# Patient Record
Sex: Female | Born: 1955 | Race: Black or African American | Hispanic: No | Marital: Married | Smoking: Never smoker
Health system: Southern US, Community
[De-identification: ages and names within clinical notes are randomized; demographics above are authoritative.]

## PROBLEM LIST (undated history)

## (undated) DIAGNOSIS — I10 Essential (primary) hypertension: Secondary | ICD-10-CM

## (undated) DIAGNOSIS — E7800 Pure hypercholesterolemia, unspecified: Secondary | ICD-10-CM

## (undated) DIAGNOSIS — T7840XA Allergy, unspecified, initial encounter: Secondary | ICD-10-CM

## (undated) DIAGNOSIS — J309 Allergic rhinitis, unspecified: Secondary | ICD-10-CM

## (undated) DIAGNOSIS — M1990 Unspecified osteoarthritis, unspecified site: Secondary | ICD-10-CM

## (undated) HISTORY — DX: Allergy, unspecified, initial encounter: T78.40XA

## (undated) HISTORY — DX: Pure hypercholesterolemia, unspecified: E78.00

## (undated) HISTORY — DX: Essential (primary) hypertension: I10

## (undated) HISTORY — DX: Unspecified osteoarthritis, unspecified site: M19.90

## (undated) HISTORY — DX: Allergic rhinitis, unspecified: J30.9

## (undated) HISTORY — PX: TUBAL LIGATION: SHX77

---

## 2006-09-30 HISTORY — PX: REDUCTION MAMMAPLASTY: SUR839

## 2008-10-18 ENCOUNTER — Encounter: Admission: RE | Admit: 2008-10-18 | Discharge: 2008-10-18 | Payer: Self-pay | Admitting: Internal Medicine

## 2008-10-19 ENCOUNTER — Encounter: Admission: RE | Admit: 2008-10-19 | Discharge: 2008-10-19 | Payer: Self-pay | Admitting: Internal Medicine

## 2009-11-29 ENCOUNTER — Encounter: Admission: RE | Admit: 2009-11-29 | Discharge: 2009-11-29 | Payer: Self-pay | Admitting: Internal Medicine

## 2010-06-17 ENCOUNTER — Emergency Department (HOSPITAL_COMMUNITY): Admission: EM | Admit: 2010-06-17 | Discharge: 2010-06-17 | Payer: Self-pay | Admitting: Emergency Medicine

## 2010-10-21 ENCOUNTER — Encounter: Payer: Self-pay | Admitting: Internal Medicine

## 2010-11-07 ENCOUNTER — Other Ambulatory Visit: Payer: Self-pay | Admitting: Internal Medicine

## 2010-11-07 DIAGNOSIS — Z1231 Encounter for screening mammogram for malignant neoplasm of breast: Secondary | ICD-10-CM

## 2010-12-04 ENCOUNTER — Ambulatory Visit
Admission: RE | Admit: 2010-12-04 | Discharge: 2010-12-04 | Disposition: A | Discharge status: Home / Self Care | Payer: BC Managed Care – PPO | Source: Ambulatory Visit | Attending: Internal Medicine | Admitting: Internal Medicine

## 2010-12-04 DIAGNOSIS — Z1231 Encounter for screening mammogram for malignant neoplasm of breast: Secondary | ICD-10-CM

## 2010-12-13 LAB — POCT CARDIAC MARKERS
CKMB, poc: <1 ng/mL — ABNORMAL LOW (ref 1.0–8.0)
Myoglobin, poc: 98.2 ng/mL (ref 12–200)

## 2010-12-13 LAB — CBC
Hemoglobin: 13.5 g/dL (ref 12.0–15.0)
MCHC: 34.4 g/dL (ref 30.0–36.0)
MCV: 95.4 fL (ref 78.0–100.0)

## 2010-12-13 LAB — DIFFERENTIAL
Basophils Relative: 1 % (ref 0–1)
Eosinophils Absolute: 0 10*3/uL (ref 0.0–0.7)
Lymphocytes Relative: 40 % (ref 12–46)
Lymphs Abs: 2 10*3/uL (ref 0.7–4.0)
Monocytes Absolute: 0.3 10*3/uL (ref 0.1–1.0)
Monocytes Relative: 6 % (ref 3–12)
Neutro Abs: 2.6 10*3/uL (ref 1.7–7.7)
Neutrophils Relative %: 53 % (ref 43–77)

## 2010-12-13 LAB — POCT I-STAT, CHEM 8
Hemoglobin: 14.6 g/dL (ref 12.0–15.0)
TCO2: 31 mmol/L (ref 0–100)

## 2011-12-17 ENCOUNTER — Other Ambulatory Visit: Payer: Self-pay | Admitting: Internal Medicine

## 2011-12-17 DIAGNOSIS — Z1231 Encounter for screening mammogram for malignant neoplasm of breast: Secondary | ICD-10-CM

## 2012-01-01 ENCOUNTER — Ambulatory Visit
Admission: RE | Admit: 2012-01-01 | Discharge: 2012-01-01 | Disposition: A | Discharge status: Home / Self Care | Payer: BC Managed Care – PPO | Source: Ambulatory Visit | Attending: Internal Medicine | Admitting: Internal Medicine

## 2012-01-01 DIAGNOSIS — Z1231 Encounter for screening mammogram for malignant neoplasm of breast: Secondary | ICD-10-CM

## 2013-02-23 ENCOUNTER — Other Ambulatory Visit: Payer: Self-pay

## 2013-02-23 DIAGNOSIS — Z1231 Encounter for screening mammogram for malignant neoplasm of breast: Secondary | ICD-10-CM

## 2013-03-22 ENCOUNTER — Ambulatory Visit: Payer: BC Managed Care – PPO

## 2013-03-22 ENCOUNTER — Ambulatory Visit
Admission: RE | Admit: 2013-03-22 | Discharge: 2013-03-22 | Disposition: A | Discharge status: Home / Self Care | Payer: BC Managed Care – PPO | Source: Ambulatory Visit

## 2013-03-22 DIAGNOSIS — Z1231 Encounter for screening mammogram for malignant neoplasm of breast: Secondary | ICD-10-CM

## 2014-05-11 ENCOUNTER — Other Ambulatory Visit: Payer: Self-pay

## 2014-05-11 DIAGNOSIS — Z1231 Encounter for screening mammogram for malignant neoplasm of breast: Secondary | ICD-10-CM

## 2014-05-20 ENCOUNTER — Ambulatory Visit
Admission: RE | Admit: 2014-05-20 | Discharge: 2014-05-20 | Disposition: A | Discharge status: Home / Self Care | Payer: BC Managed Care – PPO | Source: Ambulatory Visit

## 2014-05-20 DIAGNOSIS — Z1231 Encounter for screening mammogram for malignant neoplasm of breast: Secondary | ICD-10-CM

## 2015-05-03 ENCOUNTER — Other Ambulatory Visit: Payer: Self-pay

## 2015-05-03 DIAGNOSIS — Z1231 Encounter for screening mammogram for malignant neoplasm of breast: Secondary | ICD-10-CM

## 2015-05-24 ENCOUNTER — Ambulatory Visit
Admission: RE | Admit: 2015-05-24 | Discharge: 2015-05-24 | Disposition: A | Discharge status: Home / Self Care | Payer: BC Managed Care – PPO | Source: Ambulatory Visit

## 2015-05-24 DIAGNOSIS — Z1231 Encounter for screening mammogram for malignant neoplasm of breast: Secondary | ICD-10-CM

## 2016-04-18 ENCOUNTER — Other Ambulatory Visit: Payer: Self-pay | Admitting: Internal Medicine

## 2016-04-18 DIAGNOSIS — Z1231 Encounter for screening mammogram for malignant neoplasm of breast: Secondary | ICD-10-CM

## 2016-04-18 DIAGNOSIS — Z9889 Other specified postprocedural states: Secondary | ICD-10-CM

## 2016-05-24 ENCOUNTER — Ambulatory Visit
Admission: RE | Admit: 2016-05-24 | Discharge: 2016-05-24 | Disposition: A | Discharge status: Home / Self Care | Payer: BC Managed Care – PPO | Source: Ambulatory Visit | Attending: Internal Medicine | Admitting: Internal Medicine

## 2016-05-24 ENCOUNTER — Ambulatory Visit: Payer: BC Managed Care – PPO

## 2016-05-24 DIAGNOSIS — Z1231 Encounter for screening mammogram for malignant neoplasm of breast: Secondary | ICD-10-CM

## 2016-05-24 DIAGNOSIS — Z9889 Other specified postprocedural states: Secondary | ICD-10-CM

## 2016-07-31 LAB — HM COLONOSCOPY

## 2017-06-12 ENCOUNTER — Other Ambulatory Visit: Payer: Self-pay | Admitting: Internal Medicine

## 2017-06-12 DIAGNOSIS — Z1231 Encounter for screening mammogram for malignant neoplasm of breast: Secondary | ICD-10-CM

## 2017-06-23 ENCOUNTER — Ambulatory Visit
Admission: RE | Admit: 2017-06-23 | Discharge: 2017-06-23 | Disposition: A | Discharge status: Home / Self Care | Payer: BC Managed Care – PPO | Source: Ambulatory Visit | Attending: Internal Medicine | Admitting: Internal Medicine

## 2017-06-23 DIAGNOSIS — Z1231 Encounter for screening mammogram for malignant neoplasm of breast: Secondary | ICD-10-CM

## 2018-05-18 ENCOUNTER — Other Ambulatory Visit: Payer: Self-pay | Admitting: Internal Medicine

## 2018-05-18 DIAGNOSIS — Z1231 Encounter for screening mammogram for malignant neoplasm of breast: Secondary | ICD-10-CM

## 2018-06-11 DIAGNOSIS — G4700 Insomnia, unspecified: Secondary | ICD-10-CM | POA: Diagnosis not present

## 2018-06-11 DIAGNOSIS — E785 Hyperlipidemia, unspecified: Secondary | ICD-10-CM | POA: Diagnosis not present

## 2018-06-11 DIAGNOSIS — Z0000 Encounter for general adult medical examination without abnormal findings: Secondary | ICD-10-CM

## 2018-06-11 DIAGNOSIS — R946 Abnormal results of thyroid function studies: Secondary | ICD-10-CM | POA: Diagnosis not present

## 2018-06-11 DIAGNOSIS — R030 Elevated blood-pressure reading, without diagnosis of hypertension: Secondary | ICD-10-CM | POA: Diagnosis not present

## 2018-06-26 ENCOUNTER — Ambulatory Visit: Payer: BC Managed Care – PPO

## 2018-06-30 ENCOUNTER — Ambulatory Visit: Payer: BC Managed Care – PPO | Admitting: Nurse Practitioner

## 2018-06-30 VITALS — BP 122/82 | HR 73 | Temp 98.1°F | Ht 64.0 in | Wt 185.6 lb

## 2018-06-30 DIAGNOSIS — J0110 Acute frontal sinusitis, unspecified: Secondary | ICD-10-CM | POA: Diagnosis not present

## 2018-06-30 MED ORDER — MOMETASONE FUROATE 50 MCG/ACT NA SUSP: 2.0000 | Freq: Every day | NASAL | 2 refills | Status: DC

## 2018-06-30 NOTE — Progress Notes (Addendum)
   Subjective:    Patient ID: Ruth Davis, female    DOB: 08/03/1956, 62 y.o.   MRN: [id number]  Sinus Problem  This is a new problem. The current episode started in the past 7 days. The problem has been gradually worsening since onset. There has been no fever. Her pain is at a severity of 9/10. Associated symptoms include congestion, coughing, headaches, sinus pressure and a sore throat. (Body aches) Past treatments include antibiotics (one dose). The treatment provided no relief.        Review of Systems  Constitutional: Negative.   HENT: Positive for congestion, sinus pressure, sinus pain and sore throat.   Respiratory: Positive for cough.   Cardiovascular: Negative.   Neurological: Positive for headaches.       Objective:   Physical Exam  Constitutional: She appears well-developed.  HENT:  Head: Normocephalic.  Right Ear: Tympanic membrane is bulging.  Left Ear: Tympanic membrane is bulging.  Nose: Mucosal edema present. Right sinus exhibits frontal sinus tenderness. Left sinus exhibits frontal sinus tenderness.  Mouth/Throat: Oropharynx is clear and moist.  Eyes: Conjunctivae are normal.  Neck: Normal range of motion.  Cardiovascular: Normal rate, regular rhythm and normal heart sounds.  Pulmonary/Chest: Effort normal and breath sounds normal.  Skin: Skin is warm.          Assessment & Plan:  .1. Acute non-recurrent frontal sinusitis  She is being treated with azithromycin, take nasonex and zyrtec daily.  If symptoms are not better return call to office.  - mometasone (NASONEX) 50 MCG/ACT nasal spray; Place 2 sprays into the nose daily.  Dispense: 17 g; Refill: 2

## 2018-06-30 NOTE — Patient Instructions (Addendum)
Drink adequate amounts of water.  Continue taking HBP [place] as needed.  Return call to office on Wednesday if not better. Will give sample of Zyrtec, can purchase over the counter if effective.

## 2018-07-08 ENCOUNTER — Ambulatory Visit
Admission: RE | Admit: 2018-07-08 | Discharge: 2018-07-08 | Disposition: A | Discharge status: Home / Self Care | Payer: BC Managed Care – PPO | Source: Ambulatory Visit | Attending: Internal Medicine | Admitting: Internal Medicine

## 2018-07-08 DIAGNOSIS — Z1231 Encounter for screening mammogram for malignant neoplasm of breast: Secondary | ICD-10-CM

## 2018-09-10 ENCOUNTER — Encounter: Payer: Self-pay | Admitting: Internal Medicine

## 2018-09-10 ENCOUNTER — Ambulatory Visit: Payer: BC Managed Care – PPO | Admitting: Internal Medicine

## 2018-09-10 VITALS — BP 124/88 | HR 57 | Temp 98.0°F | Ht 64.0 in | Wt 189.8 lb

## 2018-09-10 DIAGNOSIS — Z6832 Body mass index (BMI) 32.0-32.9, adult: Secondary | ICD-10-CM | POA: Diagnosis not present

## 2018-09-10 DIAGNOSIS — R7989 Other specified abnormal findings of blood chemistry: Secondary | ICD-10-CM | POA: Diagnosis not present

## 2018-09-10 DIAGNOSIS — E7800 Pure hypercholesterolemia, unspecified: Secondary | ICD-10-CM

## 2018-09-10 DIAGNOSIS — Z712 Person consulting for explanation of examination or test findings: Secondary | ICD-10-CM

## 2018-09-10 NOTE — Patient Instructions (Signed)
Hypothyroidism Hypothyroidism is a disorder of the thyroid. The thyroid is a large gland that is located in the lower front of the neck. The thyroid releases hormones that control how the body works. With hypothyroidism, the thyroid does not make enough of these hormones. What are the causes? Causes of hypothyroidism may include:  Viral infections.  Pregnancy.  Your own defense system (immune system) attacking your thyroid.  Certain medicines.  Birth defects.  Past radiation treatments to your head or neck.  Past treatment with radioactive iodine.  Past surgical removal of part or all of your thyroid.  Problems with the gland that is located in the center of your brain (pituitary).  What are the signs or symptoms? Signs and symptoms of hypothyroidism may include:  Feeling as though you have no energy (lethargy).  Inability to tolerate cold.  Weight gain that is not explained by a change in diet or exercise habits.  Dry skin.  Coarse hair.  Menstrual irregularity.  Slowing of thought processes.  Constipation.  Sadness or depression.  How is this diagnosed? Your health care provider may diagnose hypothyroidism with blood tests and ultrasound tests. How is this treated? Hypothyroidism is treated with medicine that replaces the hormones that your body does not make. After you begin treatment, it may take several weeks for symptoms to go away. Follow these instructions at home:  Take medicines only as directed by your health care provider.  If you start taking any new medicines, tell your health care provider.  Keep all follow-up visits as directed by your health care provider. This is important. As your condition improves, your dosage needs may change. You will need to have blood tests regularly so that your health care provider can watch your condition. Contact a health care provider if:  Your symptoms do not get better with treatment.  You are taking thyroid  replacement medicine and: ? You sweat excessively. ? You have tremors. ? You feel anxious. ? You lose weight rapidly. ? You cannot tolerate heat. ? You have emotional swings. ? You have diarrhea. ? You feel weak. Get help right away if:  You develop chest pain.  You develop an irregular heartbeat.  You develop a rapid heartbeat. This information is not intended to replace advice given to you by your health care provider. Make sure you discuss any questions you have with your health care provider. Document Released: 09/16/2005 Document Revised: 02/22/2016 Document Reviewed: 02/01/2014 Elsevier Interactive Patient Education  2018 [employer]  

## 2018-09-11 LAB — TSH: TSH: 2.92 u[IU]/mL (ref 0.450–4.500)

## 2018-09-12 NOTE — Progress Notes (Signed)
Your thyroid fxn is nl. Happy holidays to you and your family!

## 2018-09-16 ENCOUNTER — Telehealth: Payer: Self-pay

## 2018-09-16 NOTE — Telephone Encounter (Signed)
Pt notified of recent lab results

## 2018-09-16 NOTE — Telephone Encounter (Signed)
Left the pt a message to call back for lab results.  

## 2018-09-16 NOTE — Telephone Encounter (Signed)
-----   Message from Dorothyann Pengobyn Sanders, MD sent at 09/12/2018  5:50 AM EST ----- Your thyroid fxn is nl. Happy holidays to you and your family!

## 2018-09-30 NOTE — Progress Notes (Signed)
  Subjective:     Patient ID: Ruth Davis , female    DOB: Jan 29, 1956 , 63 y.o.   MRN: [id number]   Chief Complaint  Patient presents with  . thyroid f/u    HPI  She is here today for further evaluation of abnormal thyroid labs. She is not taking any thyroid medication at this time.     History reviewed. No pertinent past medical history.   Family History  Problem Relation Age of Onset  . Breast cancer Sister   . Breast cancer Maternal Grandmother   . Alzheimer's disease Mother   . Bone cancer Father   . Throat cancer Brother   . Breast cancer Sister   . Colon cancer Sister      Current Outpatient Medications:  .  Cholecalciferol (VITAMIN D3) 125 MCG (5000 UT) CAPS, Take by mouth., Disp: , Rfl:  .  Linoleic Acid-Sunflower Oil (CLA PO), Take by mouth., Disp: , Rfl:  .  mometasone (NASONEX) 50 MCG/ACT nasal spray, Place 2 sprays into the nose daily., Disp: 17 g, Rfl: 2 .  Multiple Vitamin (MULTIVITAMIN WITH MINERALS) TABS tablet, Take 1 tablet by mouth daily., Disp: , Rfl:  .  Omega-3 Fatty Acids (FISH OIL PO), Take by mouth., Disp: , Rfl:    Allergies  Allergen Reactions  . Acetaminophen Other (See Comments)    Hives/swelling  . Fomepizole Other (See Comments)    Hives/swelling     Review of Systems  Constitutional: Negative.   Respiratory: Negative.   Cardiovascular: Negative.   Gastrointestinal: Negative.   Neurological: Negative.   Psychiatric/Behavioral: Negative.      Today's Vitals   09/10/18 1536  BP: 124/88  Pulse: (!) 57  Temp: 98 F (36.7 C)  TempSrc: Oral  Weight: 189 lb 12.8 oz (86.1 kg)  Height: 5\' 4"  (1.626 m)   Body mass index is 32.58 kg/m.   Objective:  Physical Exam Vitals signs and nursing note reviewed.  Constitutional:      Appearance: Normal appearance. She is obese.  HENT:     Head: Normocephalic and atraumatic.  Neck:     Musculoskeletal: Normal range of motion.  Cardiovascular:     Rate and Rhythm: Normal rate  and regular rhythm.     Heart sounds: Normal heart sounds.  Pulmonary:     Effort: Pulmonary effort is normal.     Breath sounds: Normal breath sounds.  Skin:    General: Skin is warm.  Neurological:     General: No focal deficit present.     Mental Status: She is alert.  Psychiatric:        Mood and Affect: Mood normal.         Assessment And Plan:     1. Abnormal thyroid blood test  I will check a TSH today. I will add on additional thyroid studies if needed. She is in agreement with her treatment plan.   - TSH  2. Pure hypercholesterolemia  Recent lipid panel results reviewed in full detail. She is encouraged to avoid fried foods, exercise 30 minutes five days weekly and to cut back on her meat intake.   3. BMI 32.0-32.9,adult  She is encouraged to strive for BMI less than 29 to decrease cardiac risk.   Gwynneth Aliment, MD

## 2018-12-07 ENCOUNTER — Encounter: Payer: Self-pay | Admitting: Nurse Practitioner

## 2018-12-07 ENCOUNTER — Ambulatory Visit: Payer: BC Managed Care – PPO | Admitting: Nurse Practitioner

## 2018-12-07 VITALS — BP 124/70 | HR 57 | Temp 98.2°F | Ht 64.0 in | Wt 191.2 lb

## 2018-12-07 DIAGNOSIS — R05 Cough: Secondary | ICD-10-CM

## 2018-12-07 DIAGNOSIS — R059 Cough, unspecified: Secondary | ICD-10-CM

## 2018-12-07 DIAGNOSIS — J0110 Acute frontal sinusitis, unspecified: Secondary | ICD-10-CM

## 2018-12-07 MED ORDER — AMOXICILLIN-POT CLAVULANATE 875-125 MG PO TABS: 1.0000 | ORAL_TABLET | Freq: Two times a day (BID) | ORAL | 0 refills | Status: AC

## 2018-12-07 MED ORDER — BENZONATATE 100 MG PO CAPS: 100.0000 mg | ORAL_CAPSULE | Freq: Four times a day (QID) | ORAL | 1 refills | Status: AC | PRN

## 2018-12-07 NOTE — Progress Notes (Signed)
Subjective:     Patient ID: Ruth Davis , female    DOB: 1955/10/16 , 63 y.o.   MRN: [id number]   Chief Complaint  Patient presents with  . Cough    coughing, headaches, bodyaches,  chills, started last week     HPI  She was with her sister a few weeks ago while in the hospital.    Sore Throat   This is a new problem. The current episode started 1 to 4 weeks ago (5 days sore throat). The problem has been gradually worsening. Neither side of throat is experiencing more pain than the other. There has been no fever. Associated symptoms include coughing (yellow green secretions.). Pertinent negatives include no abdominal pain, congestion, headaches or shortness of breath. She has had no exposure to strep. Treatments tried: honey, tea and mucinex, alka seltzer.     No past medical history on file.   Family History  Problem Relation Age of Onset  . Breast cancer Sister   . Breast cancer Maternal Grandmother   . Alzheimer's disease Mother   . Bone cancer Father   . Throat cancer Brother   . Breast cancer Sister   . Colon cancer Sister      Current Outpatient Medications:  .  Cholecalciferol (VITAMIN D3) 125 MCG (5000 UT) CAPS, Take by mouth., Disp: , Rfl:  .  mometasone (NASONEX) 50 MCG/ACT nasal spray, Place 2 sprays into the nose daily., Disp: 17 g, Rfl: 2 .  Omega-3 Fatty Acids (FISH OIL PO), Take by mouth., Disp: , Rfl:    Allergies  Allergen Reactions  . Acetaminophen Other (See Comments)    Hives/swelling  . Fomepizole Other (See Comments)    Hives/swelling     Review of Systems  Constitutional: Negative for fatigue and fever.  HENT: Positive for sore throat. Negative for congestion, postnasal drip, rhinorrhea and sinus pressure.   Respiratory: Positive for cough (yellow green secretions.). Negative for chest tightness, shortness of breath and wheezing.   Cardiovascular: Negative for chest pain, palpitations and leg swelling.  Gastrointestinal: Negative for  abdominal pain.  Neurological: Negative for dizziness and headaches.     Today's Vitals   12/07/18 1524  BP: 124/70  Pulse: (!) 57  Temp: 98.2 F (36.8 C)  TempSrc: Core  SpO2: 98%  Weight: 191 lb 3.2 oz (86.7 kg)  Height: 5\' 4"  (1.626 m)   Body mass index is 32.82 kg/m.   Objective:  Physical Exam Vitals signs reviewed.  Constitutional:      Appearance: Normal appearance.  HENT:     Head: Normocephalic and atraumatic.     Right Ear: Tympanic membrane, ear canal and external ear normal. There is no impacted cerumen.     Left Ear: Tympanic membrane, ear canal and external ear normal. There is no impacted cerumen.     Nose: Nose normal. No congestion.     Right Sinus: No maxillary sinus tenderness or frontal sinus tenderness.     Left Sinus: No maxillary sinus tenderness or frontal sinus tenderness.     Mouth/Throat:     Mouth: Mucous membranes are moist.     Pharynx: Posterior oropharyngeal erythema present. No pharyngeal swelling or uvula swelling.     Tonsils: Swelling: 2+ on the left.  Eyes:     Extraocular Movements: Extraocular movements intact.     Conjunctiva/sclera: Conjunctivae normal.     Pupils: Pupils are equal, round, and reactive to light.  Cardiovascular:     Rate and Rhythm:  Normal rate and regular rhythm.     Pulses: Normal pulses.     Heart sounds: Normal heart sounds. No murmur.  Pulmonary:     Effort: Pulmonary effort is normal.     Breath sounds: Normal breath sounds.  Neurological:     Mental Status: She is alert.  Psychiatric:        Mood and Affect: Mood normal.         Assessment And Plan:     1. Acute non-recurrent frontal sinusitis  Tenderness to frontal sinuses will treat with augmentin - amoxicillin-clavulanate (AUGMENTIN) 875-125 MG tablet; Take 1 tablet by mouth every 12 (twelve) hours for 7 days.  Dispense: 20 tablet; Refill: 0  2. Cough  Intermittent dry hacking cough - benzonatate (TESSALON PERLES) 100 MG capsule; Take 1  capsule (100 mg total) by mouth every 6 (six) hours as needed for cough.  Dispense: 30 capsule; Refill: 1   Arnette Felts, FNP

## 2018-12-25 ENCOUNTER — Encounter: Payer: Self-pay | Admitting: Internal Medicine

## 2018-12-27 ENCOUNTER — Encounter: Payer: Self-pay | Admitting: Nurse Practitioner

## 2019-04-29 ENCOUNTER — Encounter: Payer: Self-pay | Admitting: Internal Medicine

## 2019-05-03 ENCOUNTER — Encounter: Payer: Self-pay | Admitting: Internal Medicine

## 2019-05-03 ENCOUNTER — Ambulatory Visit: Payer: BC Managed Care – PPO | Admitting: Internal Medicine

## 2019-06-25 ENCOUNTER — Other Ambulatory Visit: Payer: Self-pay | Admitting: Internal Medicine

## 2019-06-25 DIAGNOSIS — Z1231 Encounter for screening mammogram for malignant neoplasm of breast: Secondary | ICD-10-CM

## 2019-06-29 ENCOUNTER — Ambulatory Visit: Payer: BC Managed Care – PPO | Admitting: Internal Medicine

## 2019-06-29 ENCOUNTER — Other Ambulatory Visit: Payer: Self-pay

## 2019-06-29 ENCOUNTER — Encounter: Payer: Self-pay | Admitting: Internal Medicine

## 2019-06-29 VITALS — BP 120/64 | HR 67 | Temp 98.2°F | Ht 64.0 in | Wt 188.0 lb

## 2019-06-29 DIAGNOSIS — I8311 Varicose veins of right lower extremity with inflammation: Secondary | ICD-10-CM

## 2019-06-29 DIAGNOSIS — G8929 Other chronic pain: Secondary | ICD-10-CM | POA: Diagnosis not present

## 2019-06-29 DIAGNOSIS — I8312 Varicose veins of left lower extremity with inflammation: Secondary | ICD-10-CM

## 2019-06-29 DIAGNOSIS — Z23 Encounter for immunization: Secondary | ICD-10-CM

## 2019-06-29 DIAGNOSIS — M542 Cervicalgia: Secondary | ICD-10-CM

## 2019-06-29 DIAGNOSIS — Z0000 Encounter for general adult medical examination without abnormal findings: Secondary | ICD-10-CM

## 2019-06-29 DIAGNOSIS — Z6832 Body mass index (BMI) 32.0-32.9, adult: Secondary | ICD-10-CM | POA: Diagnosis not present

## 2019-06-29 DIAGNOSIS — M25512 Pain in left shoulder: Secondary | ICD-10-CM | POA: Diagnosis not present

## 2019-06-29 LAB — POCT URINALYSIS DIPSTICK
Bilirubin, UA: NEGATIVE
Blood, UA: NEGATIVE
Glucose, UA: NEGATIVE
Ketones, UA: NEGATIVE
Leukocytes, UA: NEGATIVE
Nitrite, UA: NEGATIVE
Protein, UA: NEGATIVE
Spec Grav, UA: <=1.005 — AB (ref 1.010–1.025)
Urobilinogen, UA: 0.2 E.U./dL
pH, UA: 6 (ref 5.0–8.0)

## 2019-06-29 NOTE — Progress Notes (Signed)
Subjective:     Patient ID: Ruth Davis , female    DOB: 26-Nov-1955 , 63 y.o.   MRN: [id number]   Chief Complaint  Patient presents with  . Annual Exam  . Shoulder Pain    HPI  She is here today for a full physical exam. She had her last pap smear with me 3 years ago.   Shoulder Pain  The pain is present in the neck and left shoulder. This is a chronic problem. The current episode started more than 1 month ago. There has been no history of extremity trauma. The problem occurs constantly. The problem has been unchanged. The quality of the pain is described as aching and dull. The pain is at a severity of 6/10. The pain is moderate. Pertinent negatives include no inability to bear weight or joint swelling.     History reviewed. No pertinent past medical history.   Family History  Problem Relation Age of Onset  . Breast cancer Sister   . Breast cancer Maternal Grandmother   . Alzheimer's disease Mother   . Bone cancer Father   . Throat cancer Brother   . Breast cancer Sister   . Colon cancer Sister      Current Outpatient Medications:  .  benzonatate (TESSALON PERLES) 100 MG capsule, Take 1 capsule (100 mg total) by mouth every 6 (six) hours as needed for cough., Disp: 30 capsule, Rfl: 1 .  Cholecalciferol (VITAMIN D3) 125 MCG (5000 UT) CAPS, Take by mouth., Disp: , Rfl:  .  mometasone (NASONEX) 50 MCG/ACT nasal spray, Place 2 sprays into the nose daily., Disp: 17 g, Rfl: 2 .  Omega-3 Fatty Acids (FISH OIL PO), Take by mouth., Disp: , Rfl:  .  OVER THE COUNTER MEDICATION, livlean, Disp: , Rfl:    Allergies  Allergen Reactions  . Acetaminophen Other (See Comments)    Hives/swelling  . Fomepizole Other (See Comments)    Hives/swelling     The patient states she uses post menopausal status for birth control. Last LMP was No LMP recorded. Patient is postmenopausal.. Negative for Dysmenorrhea '@MAMMOFINDINGS' @. Negative for: breast discharge, breast lump(s), breast pain  and breast self exam. Associated symptoms include abnormal vaginal bleeding. Pertinent negatives include abnormal bleeding (hematology), anxiety, decreased libido, depression, difficulty falling sleep, dyspareunia, history of infertility, nocturia, sexual dysfunction, sleep disturbances, urinary incontinence, urinary urgency, vaginal discharge and vaginal itching. Diet regular.The patient states her exercise level is  moderate.   . The patient's tobacco use is:  Social History   Tobacco Use  Smoking Status Never Smoker  Smokeless Tobacco Never Used  . She has been exposed to passive smoke. The patient's alcohol use is:  Social History   Substance and Sexual Activity  Alcohol Use Never  . Frequency: Never    Review of Systems  Constitutional: Negative.   HENT: Negative.   Eyes: Negative.   Respiratory: Negative.   Cardiovascular: Negative.   Endocrine: Negative.   Genitourinary: Negative.   Musculoskeletal: Positive for arthralgias.       She c/o r shoulder pain. Denies fall/trauma.   Skin: Negative.   Allergic/Immunologic: Negative.   Neurological: Negative.   Hematological: Negative.   Psychiatric/Behavioral: Negative.      Today's Vitals   06/29/19 0912  BP: 120/64  Pulse: 67  Temp: 98.2 F (36.8 C)  TempSrc: Oral  SpO2: 96%  Weight: 188 lb (85.3 kg)  Height: '5\' 4"'  (1.626 m)   Body mass index is 32.27 kg/m.  Objective:  Physical Exam Vitals signs and nursing note reviewed.  Constitutional:      Appearance: Normal appearance. She is obese.  HENT:     Head: Normocephalic and atraumatic.     Right Ear: Tympanic membrane, ear canal and external ear normal.     Left Ear: Tympanic membrane, ear canal and external ear normal.     Nose: Nose normal.     Mouth/Throat:     Mouth: Mucous membranes are moist.     Pharynx: Oropharynx is clear.  Eyes:     Extraocular Movements: Extraocular movements intact.     Conjunctiva/sclera: Conjunctivae normal.     Pupils:  Pupils are equal, round, and reactive to light.  Neck:     Musculoskeletal: Normal range of motion and neck supple.  Cardiovascular:     Rate and Rhythm: Normal rate and regular rhythm.     Pulses: Normal pulses.     Heart sounds: Normal heart sounds.     Comments: Spider veins/varicose veins noted b/l LE Pulmonary:     Effort: Pulmonary effort is normal.     Breath sounds: Normal breath sounds.  Abdominal:     General: Abdomen is flat. Bowel sounds are normal.     Palpations: Abdomen is soft.  Genitourinary:    Comments: deferred Musculoskeletal: Normal range of motion.     Left shoulder: She exhibits tenderness.     Cervical back: She exhibits tenderness and spasm.     Comments: L shoulder painful w/ movement.   Skin:    General: Skin is warm and dry.     Comments: 1cm flesh-colored palpable cyst-like structure on right lower leg, overlying Achilles tendon. Nontender.   Neurological:     General: No focal deficit present.     Mental Status: She is alert and oriented to person, place, and time.  Psychiatric:        Mood and Affect: Mood normal.        Behavior: Behavior normal.         Assessment And Plan:     1. Encounter for annual physical exam  A full exam was performed.  Importance of monthly self breast exams was discussed with the patient. PATIENT HAS BEEN ADVISED TO GET 30-45 MINUTES REGULAR EXERCISE NO LESS THAN FOUR TO FIVE DAYS PER WEEK - BOTH WEIGHTBEARING EXERCISES AND AEROBIC ARE RECOMMENDED.  SHE WAS ADVISED TO FOLLOW A HEALTHY DIET WITH AT LEAST SIX FRUITS/VEGGIES PER DAY, DECREASE INTAKE OF RED MEAT, AND TO INCREASE FISH INTAKE TO TWO DAYS PER WEEK.  MEATS/FISH SHOULD NOT BE FRIED, BAKED OR BROILED IS PREFERABLE.  I SUGGEST WEARING SPF 50 SUNSCREEN ON EXPOSED PARTS AND ESPECIALLY WHEN IN THE DIRECT SUNLIGHT FOR AN EXTENDED PERIOD OF TIME.  PLEASE AVOID FAST FOOD RESTAURANTS AND INCREASE YOUR WATER INTAKE.  - POCT Urinalysis Dipstick (81002) - CMP14+EGFR -  CBC - Lipid panel - Hemoglobin A1c  2. Chronic left shoulder pain  She is advised to apply topical mentholated cream nightly as needed. If persistent, I will prescribe oral NSAIDS to take 10-14 days and then as needed. However, due to chronicity of her symptoms - I will refer her to Ortho for radiographic studies and further evaluation.   - Ambulatory referral to Orthopedic Surgery  3. Cervicalgia  She is advised to take magnesium nightly to help with muscle spasms.    - Ambulatory referral to Orthopedic Surgery  4. Varicose veins of both lower extremities with inflammation  Pt agreeable  to referral for vascular studies/evaluation.   - Ambulatory referral to Vascular Surgery  5. Need for influenza vaccination  - Flu Vaccine QUAD 6+ mos PF IM (Fluarix Quad PF)  6. BMI 32.0-32.9,adult  Importance of achieving optimal weight to decrease risk of cardiovascular disease and cancers was discussed with the patient in full detail. She is encouraged to continue with her regular exercise regimen.  She was given tips to incorporate more activity into her daily routine - take stairs when possible, park farther away from her job, grocery stores, etc.     .      [name], MD    THE PATIENT IS ENCOURAGED TO PRACTICE SOCIAL DISTANCING DUE TO THE COVID-19 PANDEMIC.

## 2019-06-29 NOTE — Patient Instructions (Addendum)
Start Calm nightly.   Health Maintenance, Female Adopting a healthy lifestyle and getting preventive care are important in promoting health and wellness. Ask your health care provider about:  The right schedule for you to have regular tests and exams.  Things you can do on your own to prevent diseases and keep yourself healthy. What should I know about diet, weight, and exercise? Eat a healthy diet   Eat a diet that includes plenty of vegetables, fruits, low-fat dairy products, and lean protein.  Do not eat a lot of foods that are high in solid fats, added sugars, or sodium. Maintain a healthy weight Body mass index (BMI) is used to identify weight problems. It estimates body fat based on height and weight. Your health care provider can help determine your BMI and help you achieve or maintain a healthy weight. Get regular exercise Get regular exercise. This is one of the most important things you can do for your health. Most adults should:  Exercise for at least 150 minutes each week. The exercise should increase your heart rate and make you sweat (moderate-intensity exercise).  Do strengthening exercises at least twice a week. This is in addition to the moderate-intensity exercise.  Spend less time sitting. Even light physical activity can be beneficial. Watch cholesterol and blood lipids Have your blood tested for lipids and cholesterol at 63 years of age, then have this test every 5 years. Have your cholesterol levels checked more often if:  Your lipid or cholesterol levels are high.  You are older than 63 years of age.  You are at high risk for heart disease. What should I know about cancer screening? Depending on your health history and family history, you may need to have cancer screening at various ages. This may include screening for:  Breast cancer.  Cervical cancer.  Colorectal cancer.  Skin cancer.  Lung cancer. What should I know about heart disease,  diabetes, and high blood pressure? Blood pressure and heart disease  High blood pressure causes heart disease and increases the risk of stroke. This is more likely to develop in people who have high blood pressure readings, are of African descent, or are overweight.  Have your blood pressure checked: ? Every 3-5 years if you are 58-105 years of age. ? Every year if you are 41 years old or older. Diabetes Have regular diabetes screenings. This checks your fasting blood sugar level. Have the screening done:  Once every three years after age 71 if you are at a normal weight and have a low risk for diabetes.  More often and at a younger age if you are overweight or have a high risk for diabetes. What should I know about preventing infection? Hepatitis B If you have a higher risk for hepatitis B, you should be screened for this virus. Talk with your health care provider to find out if you are at risk for hepatitis B infection. Hepatitis C Testing is recommended for:  Everyone born from [birth year] through 1965.  Anyone with known risk factors for hepatitis C. Sexually transmitted infections (STIs)  Get screened for STIs, including gonorrhea and chlamydia, if: ? You are sexually active and are younger than 63 years of age. ? You are older than 63 years of age and your health care provider tells you that you are at risk for this type of infection. ? Your sexual activity has changed since you were last screened, and you are at increased risk for chlamydia or gonorrhea. Ask  your health care provider if you are at risk.  Ask your health care provider about whether you are at high risk for HIV. Your health care provider may recommend a prescription medicine to help prevent HIV infection. If you choose to take medicine to prevent HIV, you should first get tested for HIV. You should then be tested every 3 months for as long as you are taking the medicine. Pregnancy  If you are about to stop having your  period (premenopausal) and you may become pregnant, seek counseling before you get pregnant.  Take 400 to 800 micrograms (mcg) of folic acid every day if you become pregnant.  Ask for birth control (contraception) if you want to prevent pregnancy. Osteoporosis and menopause Osteoporosis is a disease in which the bones lose minerals and strength with aging. This can result in bone fractures. If you are 65 years old or older, or if you are at risk for osteoporosis and fractures, ask your health care provider if you should:  Be screened for bone loss.  Take a calcium or vitamin D supplement to lower your risk of fractures.  Be given hormone replacement therapy (HRT) to treat symptoms of menopause. Follow these instructions at home: Lifestyle  Do not use any products that contain nicotine or tobacco, such as cigarettes, e-cigarettes, and chewing tobacco. If you need help quitting, ask your health care provider.  Do not use street drugs.  Do not share needles.  Ask your health care provider for help if you need support or information about quitting drugs. Alcohol use  Do not drink alcohol if: ? Your health care provider tells you not to drink. ? You are pregnant, may be pregnant, or are planning to become pregnant.  If you drink alcohol: ? Limit how much you use to 0-1 drink a day. ? Limit intake if you are breastfeeding.  Be aware of how much alcohol is in your drink. In the [place] one drink equals one 12 oz bottle of beer (355 mL), one 5 oz glass of wine (148 mL), or one 1 oz glass of hard liquor (44 mL). General instructions  Schedule regular health, dental, and eye exams.  Stay current with your vaccines.  Tell your health care provider if: ? You often feel depressed. ? You have ever been abused or do not feel safe at home. Summary  Adopting a healthy lifestyle and getting preventive care are important in promoting health and wellness.  Follow your health care provider's  instructions about healthy diet, exercising, and getting tested or screened for diseases.  Follow your health care provider's instructions on monitoring your cholesterol and blood pressure. This information is not intended to replace advice given to you by your health care provider. Make sure you discuss any questions you have with your health care provider. Document Released: 04/01/2011 Document Revised: 09/09/2018 Document Reviewed: 09/09/2018 Elsevier Patient Education  2020 Elsevier Inc.  

## 2019-07-02 ENCOUNTER — Encounter: Payer: Self-pay | Admitting: Internal Medicine

## 2019-07-02 LAB — CBC
Hematocrit: 41.1 % (ref 34.0–46.6)
Hemoglobin: 13.5 g/dL (ref 11.1–15.9)
MCH: 31 pg (ref 26.6–33.0)
MCHC: 32.8 g/dL (ref 31.5–35.7)
MCV: 95 fL (ref 79–97)
Platelets: 279 10*3/uL (ref 150–450)
RBC: 4.35 x10E6/uL (ref 3.77–5.28)
RDW: 13.1 % (ref 11.7–15.4)
WBC: 3.4 10*3/uL (ref 3.4–10.8)

## 2019-07-02 LAB — HEMOGLOBIN A1C
Est. average glucose Bld gHb Est-mCnc: 120 mg/dL
Hgb A1c MFr Bld: 5.8 % — ABNORMAL HIGH (ref 4.8–5.6)

## 2019-07-02 LAB — LIPID PANEL
Chol/HDL Ratio: 4.4 ratio (ref 0.0–4.4)
Cholesterol, Total: 258 mg/dL — ABNORMAL HIGH (ref 100–199)
HDL: 58 mg/dL (ref >39)
LDL Chol Calc (NIH): 186 mg/dL — ABNORMAL HIGH (ref 0–99)
Triglycerides: 84 mg/dL (ref 0–149)
VLDL Cholesterol Cal: 14 mg/dL (ref 5–40)

## 2019-07-02 LAB — CMP14+EGFR
ALT: 20 IU/L (ref 0–32)
AST: 30 IU/L (ref 0–40)
Albumin/Globulin Ratio: 1.3 (ref 1.2–2.2)
Albumin: 4 g/dL (ref 3.8–4.8)
Alkaline Phosphatase: 66 IU/L (ref 39–117)
BUN/Creatinine Ratio: 13 (ref 12–28)
BUN: 11 mg/dL (ref 8–27)
Bilirubin Total: 0.3 mg/dL (ref 0.0–1.2)
CO2: 26 mmol/L (ref 20–29)
Calcium: 9 mg/dL (ref 8.7–10.3)
Chloride: 104 mmol/L (ref 96–106)
Creatinine, Ser: 0.88 mg/dL (ref 0.57–1.00)
GFR calc Af Amer: 81 mL/min/{1.73_m2} (ref >59)
GFR calc non Af Amer: 70 mL/min/{1.73_m2} (ref >59)
Globulin, Total: 3.1 g/dL (ref 1.5–4.5)
Glucose: 88 mg/dL (ref 65–99)
Potassium: 4.7 mmol/L (ref 3.5–5.2)
Sodium: 142 mmol/L (ref 134–144)
Total Protein: 7.1 g/dL (ref 6.0–8.5)

## 2019-07-05 ENCOUNTER — Telehealth: Payer: Self-pay

## 2019-07-05 NOTE — Telephone Encounter (Signed)
Pt sent mychart message to decline medication    Dr. Baird Cancer,   I would like to wait 3-6 months before starting a medication and work on getting my LDL level down. Admittedly, I have been eating fried foods 1-2 times a week. I will cut back significantly. I will also work on increasing my fiber intake.    Enid Derry

## 2019-08-04 ENCOUNTER — Other Ambulatory Visit: Payer: Self-pay

## 2019-08-04 DIAGNOSIS — I83893 Varicose veins of bilateral lower extremities with other complications: Secondary | ICD-10-CM

## 2019-08-11 ENCOUNTER — Encounter (HOSPITAL_COMMUNITY): Payer: BC Managed Care – PPO

## 2019-08-12 ENCOUNTER — Other Ambulatory Visit: Payer: Self-pay

## 2019-08-12 ENCOUNTER — Ambulatory Visit
Admission: RE | Admit: 2019-08-12 | Discharge: 2019-08-12 | Disposition: A | Discharge status: Home / Self Care | Payer: BC Managed Care – PPO | Source: Ambulatory Visit | Attending: Internal Medicine | Admitting: Internal Medicine

## 2019-08-12 DIAGNOSIS — Z1231 Encounter for screening mammogram for malignant neoplasm of breast: Secondary | ICD-10-CM

## 2019-12-09 ENCOUNTER — Ambulatory Visit: Payer: BC Managed Care – PPO | Attending: Family

## 2019-12-09 DIAGNOSIS — Z23 Encounter for immunization: Secondary | ICD-10-CM

## 2019-12-09 NOTE — Progress Notes (Signed)
   Covid-19 Vaccination Clinic  Name:  Ruth Davis    MRN: [id number] DOB: Oct 03, 1955  12/09/2019  [name] was observed post Covid-19 immunization for 15 minutes without incident. She was provided with Vaccine Information Sheet and instruction to access the V-Safe system.   [name] was instructed to call 911 with any severe reactions post vaccine: Marland Kitchen Difficulty breathing  . Swelling of face and throat  . A fast heartbeat  . A bad rash all over body  . Dizziness and weakness   Immunizations Administered    Name Date Dose VIS Date Route   Moderna COVID-19 Vaccine 12/09/2019 10:06 AM 0.5 mL 08/31/2019 Intramuscular   Manufacturer: Moderna   Lot: [id number]   NDC: 62194-712-52

## 2020-01-05 ENCOUNTER — Encounter: Payer: Self-pay | Admitting: Internal Medicine

## 2020-01-06 ENCOUNTER — Other Ambulatory Visit: Payer: Self-pay

## 2020-01-06 ENCOUNTER — Other Ambulatory Visit: Payer: Self-pay | Admitting: Internal Medicine

## 2020-01-06 ENCOUNTER — Encounter: Payer: Self-pay | Admitting: Internal Medicine

## 2020-01-06 ENCOUNTER — Ambulatory Visit: Payer: BC Managed Care – PPO | Admitting: Internal Medicine

## 2020-01-06 VITALS — BP 132/90 | HR 67 | Temp 97.8°F | Ht 64.2 in | Wt 187.6 lb

## 2020-01-06 DIAGNOSIS — I10 Essential (primary) hypertension: Secondary | ICD-10-CM

## 2020-01-06 DIAGNOSIS — R519 Headache, unspecified: Secondary | ICD-10-CM

## 2020-01-06 DIAGNOSIS — E6609 Other obesity due to excess calories: Secondary | ICD-10-CM | POA: Diagnosis not present

## 2020-01-06 DIAGNOSIS — E7800 Pure hypercholesterolemia, unspecified: Secondary | ICD-10-CM | POA: Diagnosis not present

## 2020-01-06 DIAGNOSIS — Z6832 Body mass index (BMI) 32.0-32.9, adult: Secondary | ICD-10-CM

## 2020-01-06 DIAGNOSIS — R0683 Snoring: Secondary | ICD-10-CM

## 2020-01-06 LAB — POCT URINALYSIS DIPSTICK
Bilirubin, UA: NEGATIVE
Glucose, UA: NEGATIVE
Ketones, UA: NEGATIVE
Leukocytes, UA: NEGATIVE
Nitrite, UA: NEGATIVE
Protein, UA: NEGATIVE
Spec Grav, UA: >=1.03 — AB (ref 1.010–1.025)
Urobilinogen, UA: 0.2 E.U./dL
pH, UA: 5.5 (ref 5.0–8.0)

## 2020-01-06 LAB — POCT UA - MICROALBUMIN
Albumin/Creatinine Ratio, Urine, POC: <30
Creatinine, POC: 200 mg/dL
Microalbumin Ur, POC: 30 mg/L

## 2020-01-06 MED ORDER — AMLODIPINE BESYLATE 2.5 MG PO TABS: 2.5000 mg | ORAL_TABLET | Freq: Every day | ORAL | 1 refills | Status: DC

## 2020-01-06 NOTE — Patient Instructions (Signed)
DASH Eating Plan DASH stands for "Dietary Approaches to Stop Hypertension." The DASH eating plan is a healthy eating plan that has been shown to reduce high blood pressure (hypertension). It may also reduce your risk for type 2 diabetes, heart disease, and stroke. The DASH eating plan may also help with weight loss. What are tips for following this plan?  General guidelines  Avoid eating more than 2,300 mg (milligrams) of salt (sodium) a day. If you have hypertension, you may need to reduce your sodium intake to 1,500 mg a day.  Limit alcohol intake to no more than 1 drink a day for nonpregnant women and 2 drinks a day for men. One drink equals 12 oz of beer, 5 oz of wine, or 1 oz of hard liquor.  Work with your health care provider to maintain a healthy body weight or to lose weight. Ask what an ideal weight is for you.  Get at least 30 minutes of exercise that causes your heart to beat faster (aerobic exercise) most days of the week. Activities may include walking, swimming, or biking.  Work with your health care provider or diet and nutrition specialist (dietitian) to adjust your eating plan to your individual calorie needs. Reading food labels   Check food labels for the amount of sodium per serving. Choose foods with less than 5 percent of the Daily Value of sodium. Generally, foods with less than 300 mg of sodium per serving fit into this eating plan.  To find whole grains, look for the word "whole" as the first word in the ingredient list. Shopping  Buy products labeled as "low-sodium" or "no salt added."  Buy fresh foods. Avoid canned foods and premade or frozen meals. Cooking  Avoid adding salt when cooking. Use salt-free seasonings or herbs instead of table salt or sea salt. Check with your health care provider or pharmacist before using salt substitutes.  Do not fry foods. Cook foods using healthy methods such as baking, boiling, grilling, and broiling instead.  Cook with  heart-healthy oils, such as olive, canola, soybean, or sunflower oil. Meal planning  Eat a balanced diet that includes: ? 5 or more servings of fruits and vegetables each day. At each meal, try to fill half of your plate with fruits and vegetables. ? Up to 6-8 servings of whole grains each day. ? Less than 6 oz of lean meat, poultry, or fish each day. A 3-oz serving of meat is about the same size as a deck of cards. One egg equals 1 oz. ? 2 servings of low-fat dairy each day. ? A serving of nuts, seeds, or beans 5 times each week. ? Heart-healthy fats. Healthy fats called Omega-3 fatty acids are found in foods such as flaxseeds and coldwater fish, like sardines, salmon, and mackerel.  Limit how much you eat of the following: ? Canned or prepackaged foods. ? Food that is high in trans fat, such as fried foods. ? Food that is high in saturated fat, such as fatty meat. ? Sweets, desserts, sugary drinks, and other foods with added sugar. ? Full-fat dairy products.  Do not salt foods before eating.  Try to eat at least 2 vegetarian meals each week.  Eat more home-cooked food and less restaurant, buffet, and fast food.  When eating at a restaurant, ask that your food be prepared with less salt or no salt, if possible. What foods are recommended? The items listed may not be a complete list. Talk with your dietitian about   what dietary choices are best for you. Grains Whole-grain or whole-wheat bread. Whole-grain or whole-wheat pasta. Brown rice. Oatmeal. Quinoa. Bulgur. Whole-grain and low-sodium cereals. Pita bread. Low-fat, low-sodium crackers. Whole-wheat flour tortillas. Vegetables Fresh or frozen vegetables (raw, steamed, roasted, or grilled). Low-sodium or reduced-sodium tomato and vegetable juice. Low-sodium or reduced-sodium tomato sauce and tomato paste. Low-sodium or reduced-sodium canned vegetables. Fruits All fresh, dried, or frozen fruit. Canned fruit in natural juice (without  added sugar). Meat and other protein foods Skinless chicken or turkey. Ground chicken or turkey. Pork with fat trimmed off. Fish and seafood. Egg whites. Dried beans, peas, or lentils. Unsalted nuts, nut butters, and seeds. Unsalted canned beans. Lean cuts of beef with fat trimmed off. Low-sodium, lean deli meat. Dairy Low-fat (1%) or fat-free (skim) milk. Fat-free, low-fat, or reduced-fat cheeses. Nonfat, low-sodium ricotta or cottage cheese. Low-fat or nonfat yogurt. Low-fat, low-sodium cheese. Fats and oils Soft margarine without trans fats. Vegetable oil. Low-fat, reduced-fat, or light mayonnaise and salad dressings (reduced-sodium). Canola, safflower, olive, soybean, and sunflower oils. Avocado. Seasoning and other foods Herbs. Spices. Seasoning mixes without salt. Unsalted popcorn and pretzels. Fat-free sweets. What foods are not recommended? The items listed may not be a complete list. Talk with your dietitian about what dietary choices are best for you. Grains Baked goods made with fat, such as croissants, muffins, or some breads. Dry pasta or rice meal packs. Vegetables Creamed or fried vegetables. Vegetables in a cheese sauce. Regular canned vegetables (not low-sodium or reduced-sodium). Regular canned tomato sauce and paste (not low-sodium or reduced-sodium). Regular tomato and vegetable juice (not low-sodium or reduced-sodium). Pickles. Olives. Fruits Canned fruit in a light or heavy syrup. Fried fruit. Fruit in cream or butter sauce. Meat and other protein foods Fatty cuts of meat. Ribs. Fried meat. Bacon. Sausage. Bologna and other processed lunch meats. Salami. Fatback. Hotdogs. Bratwurst. Salted nuts and seeds. Canned beans with added salt. Canned or smoked fish. Whole eggs or egg yolks. Chicken or turkey with skin. Dairy Whole or 2% milk, cream, and half-and-half. Whole or full-fat cream cheese. Whole-fat or sweetened yogurt. Full-fat cheese. Nondairy creamers. Whipped toppings.  Processed cheese and cheese spreads. Fats and oils Butter. Stick margarine. Lard. Shortening. Ghee. Bacon fat. Tropical oils, such as coconut, palm kernel, or palm oil. Seasoning and other foods Salted popcorn and pretzels. Onion salt, garlic salt, seasoned salt, table salt, and sea salt. Worcestershire sauce. Tartar sauce. Barbecue sauce. Teriyaki sauce. Soy sauce, including reduced-sodium. Steak sauce. Canned and packaged gravies. Fish sauce. Oyster sauce. Cocktail sauce. Horseradish that you find on the shelf. Ketchup. Mustard. Meat flavorings and tenderizers. Bouillon cubes. Hot sauce and Tabasco sauce. Premade or packaged marinades. Premade or packaged taco seasonings. Relishes. Regular salad dressings. Where to find more information:  National Heart, Lung, and Blood Institute: www.nhlbi.nih.gov  American Heart Association: www.heart.org Summary  The DASH eating plan is a healthy eating plan that has been shown to reduce high blood pressure (hypertension). It may also reduce your risk for type 2 diabetes, heart disease, and stroke.  With the DASH eating plan, you should limit salt (sodium) intake to 2,300 mg a day. If you have hypertension, you may need to reduce your sodium intake to 1,500 mg a day.  When on the DASH eating plan, aim to eat more fresh fruits and vegetables, whole grains, lean proteins, low-fat dairy, and heart-healthy fats.  Work with your health care provider or diet and nutrition specialist (dietitian) to adjust your eating plan to your   individual calorie needs. This information is not intended to replace advice given to you by your health care provider. Make sure you discuss any questions you have with your health care provider. Document Revised: 08/29/2017 Document Reviewed: 09/09/2016 Elsevier Patient Education  2020 Elsevier Inc.  

## 2020-01-06 NOTE — Progress Notes (Signed)
This visit occurred during the SARS-CoV-2 public health emergency.  Safety protocols were in place, including screening questions prior to the visit, additional usage of staff PPE, and extensive cleaning of exam room while observing appropriate contact time as indicated for disinfecting solutions.  Subjective:     Patient ID: Ruth Davis , female    DOB: 30-Jul-1956 , 64 y.o.   MRN: [id number]   Chief Complaint  Patient presents with  . Hypertension    high b/p readings and headaches    HPI  She is here today for further evaluation of elevated blood pressures.  For the past several weeks, she has had an elevated blood pressure. She has been checking her BP at home. BP readings 140s/80s - 180s/100s. She does have headache. She denies visual disturbance, chest pain and palpitations. She has experienced some daytime fatigue.     Past Medical History:  Diagnosis Date  . Allergic rhinitis      Family History  Problem Relation Age of Onset  . Breast cancer Sister   . Breast cancer Maternal Grandmother   . Alzheimer's disease Mother   . Bone cancer Father   . Throat cancer Brother   . Breast cancer Sister   . Colon cancer Sister      Current Outpatient Medications:  .  Cholecalciferol (VITAMIN D3) 125 MCG (5000 UT) CAPS, Take by mouth., Disp: , Rfl:  .  mometasone (NASONEX) 50 MCG/ACT nasal spray, Place 2 sprays into the nose daily., Disp: 17 g, Rfl: 2 .  Omega-3 Fatty Acids (FISH OIL PO), Take by mouth., Disp: , Rfl:  .  OVER THE COUNTER MEDICATION, livlean, Disp: , Rfl:    Allergies  Allergen Reactions  . Acetaminophen Other (See Comments)    Hives/swelling  . Fomepizole Other (See Comments)    Hives/swelling     Review of Systems  Constitutional: Negative.   Respiratory: Negative.   Cardiovascular: Negative.   Gastrointestinal: Negative.   Genitourinary:       + nocturia  Neurological: Positive for headaches.  Psychiatric/Behavioral: Negative.       Today's Vitals   01/06/20 1602  BP: 132/90  Pulse: 67  Temp: 97.8 F (36.6 C)  TempSrc: Oral  SpO2: 94%  Weight: 187 lb 9.6 oz (85.1 kg)  Height: 5' 4.2" (1.631 m)   Body mass index is 32 kg/m.   Wt Readings from Last 3 Encounters:  01/06/20 187 lb 9.6 oz (85.1 kg)  06/29/19 188 lb (85.3 kg)  12/07/18 191 lb 3.2 oz (86.7 kg)     Objective:  Physical Exam Vitals and nursing note reviewed.  Constitutional:      Appearance: Normal appearance. She is obese.  HENT:     Head: Normocephalic and atraumatic.  Cardiovascular:     Rate and Rhythm: Normal rate and regular rhythm.     Heart sounds: Normal heart sounds.  Pulmonary:     Effort: Pulmonary effort is normal.     Breath sounds: Normal breath sounds.  Skin:    General: Skin is warm.  Neurological:     General: No focal deficit present.     Mental Status: She is alert.  Psychiatric:        Mood and Affect: Mood normal.        Behavior: Behavior normal.         Assessment And Plan:     1. Essential hypertension, benign  New onset. I will start her on amlodipine 2.95m nightly. I anticipate  that I may need to increase dose to 79m daily. Advised to avoid adding salt to her foods and avoid packaged foods which tend to be high in sodium. I will check labs as listed below. EKG performed, NSR w/o acute changes. She agrees to rto in 3 weeks for re-evaluation. She will continue to keep BP logs as well. Initial goal for BP is less than 130/80 consistently.   - BMP8+EGFR - Lipid panel - EKG 12-Lead  2. Nonintractable episodic headache, unspecified headache type  Likely related to her elevated blood pressure. Her sx are accompanied with nocturia, headache upon awakening and non-restorative sleep. She agrees to Neuro evaluation for sleep apnea. Pt advised that untreated OSA can lead to HTN, heart failure and strokes.   3. Class 1 obesity due to excess calories without serious comorbidity with body mass index (BMI) of  32.0 to 32.9 in adult  She is encouraged to strive for BMI less than 28 to decrease cardiac risk. She will continue with her current exercise regimen.   4. Pure hypercholesterolemia  Chronic, previous lab results reviewed. I will recheck non-fasting lipid panel today. She is encouraged to avoid fried foods.   - Lipid panel  5. Snoring  Again, she agrees to sleep study.   - Ambulatory referral to Neurology    RMaximino Greenland MD    THE PATIENT IS ENCOURAGED TO PRACTICE SOCIAL DISTANCING DUE TO THE COVID-19 PANDEMIC.

## 2020-01-07 LAB — LIPID PANEL
Chol/HDL Ratio: 4.2 ratio (ref 0.0–4.4)
Cholesterol, Total: 266 mg/dL — ABNORMAL HIGH (ref 100–199)
HDL: 64 mg/dL (ref >39)
LDL Chol Calc (NIH): 177 mg/dL — ABNORMAL HIGH (ref 0–99)
Triglycerides: 141 mg/dL (ref 0–149)
VLDL Cholesterol Cal: 25 mg/dL (ref 5–40)

## 2020-01-07 LAB — BMP8+EGFR
BUN/Creatinine Ratio: 14 (ref 12–28)
BUN: 12 mg/dL (ref 8–27)
CO2: 24 mmol/L (ref 20–29)
Calcium: 9.2 mg/dL (ref 8.7–10.3)
Chloride: 102 mmol/L (ref 96–106)
Creatinine, Ser: 0.84 mg/dL (ref 0.57–1.00)
GFR calc Af Amer: 86 mL/min/{1.73_m2} (ref >59)
GFR calc non Af Amer: 74 mL/min/{1.73_m2} (ref >59)
Glucose: 88 mg/dL (ref 65–99)
Potassium: 4.2 mmol/L (ref 3.5–5.2)
Sodium: 140 mmol/L (ref 134–144)

## 2020-01-07 LAB — TSH: TSH: 2.49 u[IU]/mL (ref 0.450–4.500)

## 2020-01-11 ENCOUNTER — Ambulatory Visit: Payer: BC Managed Care – PPO | Attending: Family

## 2020-01-11 ENCOUNTER — Other Ambulatory Visit: Payer: Self-pay

## 2020-01-11 ENCOUNTER — Ambulatory Visit (INDEPENDENT_AMBULATORY_CARE_PROVIDER_SITE_OTHER): Payer: BC Managed Care – PPO

## 2020-01-11 VITALS — BP 128/70 | HR 64 | Temp 98.5°F | Ht 63.8 in | Wt 184.4 lb

## 2020-01-11 DIAGNOSIS — R3129 Other microscopic hematuria: Secondary | ICD-10-CM

## 2020-01-11 DIAGNOSIS — Z23 Encounter for immunization: Secondary | ICD-10-CM

## 2020-01-11 LAB — POCT URINALYSIS DIPSTICK
Bilirubin, UA: NEGATIVE
Glucose, UA: NEGATIVE
Ketones, UA: NEGATIVE
Leukocytes, UA: NEGATIVE
Nitrite, UA: NEGATIVE
Protein, UA: NEGATIVE
Spec Grav, UA: >=1.03 — AB (ref 1.010–1.025)
Urobilinogen, UA: 0.2 E.U./dL
pH, UA: 5.5 (ref 5.0–8.0)

## 2020-01-11 NOTE — Progress Notes (Signed)
   Covid-19 Vaccination Clinic  Name:  Ruth Davis    MRN: [id number] DOB: 01/22/1956  01/11/2020  Ruth Davis was observed post Covid-19 immunization for 15 minutes without incident. She was provided with Vaccine Information Sheet and instruction to access the V-Safe system.   Ruth Davis was instructed to call 911 with any severe reactions post vaccine: Marland Kitchen Difficulty breathing  . Swelling of face and throat  . A fast heartbeat  . A bad rash all over body  . Dizziness and weakness   Immunizations Administered    Name Date Dose VIS Date Route   Moderna COVID-19 Vaccine 01/11/2020  1:21 PM 0.5 mL 08/31/2019 Intramuscular   Manufacturer: Moderna   Lot: [id number]   NDC: 84859-276-39

## 2020-01-11 NOTE — Progress Notes (Signed)
Pt was seen today for blood pressure check and recheck of urine. Blood pressure was good today with manual cuff. Personal cuff read higher than when checked manually. Pt is being sent for [place] of kidney

## 2020-01-23 ENCOUNTER — Other Ambulatory Visit: Payer: Self-pay | Admitting: Internal Medicine

## 2020-01-23 DIAGNOSIS — R3129 Other microscopic hematuria: Secondary | ICD-10-CM

## 2020-01-25 ENCOUNTER — Other Ambulatory Visit: Payer: Self-pay

## 2020-01-25 ENCOUNTER — Encounter: Payer: Self-pay | Admitting: Neurology

## 2020-01-25 ENCOUNTER — Ambulatory Visit: Payer: BC Managed Care – PPO | Admitting: Neurology

## 2020-01-25 VITALS — BP 129/78 | HR 60 | Temp 96.8°F | Ht 65.0 in | Wt 184.0 lb

## 2020-01-25 DIAGNOSIS — R0683 Snoring: Secondary | ICD-10-CM | POA: Diagnosis not present

## 2020-01-25 DIAGNOSIS — E669 Obesity, unspecified: Secondary | ICD-10-CM

## 2020-01-25 DIAGNOSIS — R519 Headache, unspecified: Secondary | ICD-10-CM | POA: Diagnosis not present

## 2020-01-25 DIAGNOSIS — R351 Nocturia: Secondary | ICD-10-CM

## 2020-01-25 NOTE — Progress Notes (Signed)
Subjective:    Patient ID: Ruth Davis is a 64 y.o. female.  HPI     Ruth Age, MD, PhD [facility] [address], [address] [place], [place] [address]  Dear Dr. Baird Davis,   I saw your patient, Ruth Davis, upon your kind request, in my Sleep clinic today for initial consultation of her sleep disorder, in particular, concern for underlying obstructive sleep apnea.  The patient is unaccompanied today.  As you know, Ruth Davis is a 64 year old right-handed woman with an underlying medical history of hyperlipidemia, allergic rhinitis, elevated blood pressure values and mild obesity, who reports snoring and recent elevated blood pressure values as well as morning headaches.  She reports that since she was started on amlodipine her blood pressure numbers have improved and her morning headaches have also improved.  She has nocturia about twice per average night.  I reviewed your office note from 01/06/2020.  Her Epworth sleepiness score is 5/24, fatigue severity score is 11 out of 63.  She is familiar with a sleep apnea diagnosis, her sister has a CPAP machine and her husband also uses CPAP.  However, he struggles with his CPAP and sleeps with interruptions and his CPAP makes a noise which is bothersome to the patient.  She does not foresee that she would be able to tolerate CPAP therapy, nevertheless, she is willing to get tested for sleep apnea.  Her bedtime is generally between 11 and 11:30 PM, rise time is typically between 5 and 5:30 AM.  Prior to the pandemic she is to go to the gym 3 times a week in the mornings.  She was recently started on low-dose amlodipine, 2.5 mg strength daily since earlier this month.  She drinks caffeine in the form of tea, 1 or 2 servings per day, she is a non-smoker and drinks alcohol rarely, maybe 2 or 3 times a month.  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  She has 1 son, 1 daughter.  She has 1 granddaughter, Davis 6 months.  She denies telltale symptoms of restless leg syndrome or leg twitching at night.  She has occasional sleep talking per husband's report.  Her Past Medical History Is Significant For: Past Medical History:  Diagnosis Date  . Allergic rhinitis     Her Past Surgical History Is Significant For: Past Surgical History:  Procedure Laterality Date  . REDUCTION MAMMAPLASTY Bilateral 2008    Her Family History Is Significant For: Family History  Problem Relation Davis of Onset  . Breast Davis Sister   . Breast Davis Maternal Grandmother   . Alzheimer's disease Mother   . Bone Davis Father   . Throat Davis Brother   . Breast Davis Sister   . Colon Davis Sister     Her Social History Is Significant For: Social History   Socioeconomic History  . Marital status: Married    Spouse name: Not on file  . Number of children: Not on file  . Years of education: Not on file  . Highest education level: Not on file  Occupational History  . Not on file  Tobacco Use  . Smoking status: Never Smoker  . Smokeless tobacco: Never Used  Substance and Sexual Activity  . Alcohol use: Never  . Drug use: Never  . Sexual activity: Not on file  Other Topics Concern  . Not on file  Social History Narrative  .  Not on file   Social Determinants of Health   Financial Resource Strain:   . Difficulty of Paying Living Expenses:   Food Insecurity:   . Worried About Programme researcher, broadcasting/film/video in the Last Year:   . Barista in the Last Year:   Transportation Needs:   . Freight forwarder (Medical):   Marland Kitchen Lack of Transportation (Non-Medical):   Physical Activity:   . Days of Exercise per Week:   . Minutes of Exercise per Session:   Stress:   . Feeling of Stress :   Social Connections:   . Frequency of Communication with Friends and Family:   . Frequency of Social Gatherings with Friends  and Family:   . Attends Religious Services:   . Active Member of Clubs or Organizations:   . Attends Banker Meetings:   Marland Kitchen Marital Status:     Her Allergies Are:  Allergies  Allergen Reactions  . Acetaminophen Other (See Comments)    Hives/swelling  . Fomepizole Other (See Comments)    Hives/swelling  :   Her Current Medications Are:  Outpatient Encounter Medications as of 01/25/2020  Medication Sig  . amLODipine (NORVASC) 2.5 MG tablet Take 1 tablet (2.5 mg total) by mouth daily. Take with evening meal  . [DISCONTINUED] Cholecalciferol (VITAMIN D3) 125 MCG (5000 UT) CAPS Take by mouth.  . [DISCONTINUED] mometasone (NASONEX) 50 MCG/ACT nasal spray Place 2 sprays into the nose daily.  . [DISCONTINUED] Omega-3 Fatty Acids (FISH OIL PO) Take by mouth.  . [DISCONTINUED] OVER THE COUNTER MEDICATION livlean   No facility-administered encounter medications on file as of 01/25/2020.  :  Review of Systems:  Out of a complete 14 point review of systems, all are reviewed and negative with the exception of these symptoms as listed below: Review of Systems  Neurological:       Here for sleep consult. No prior sleep study.  Pt reports mild snoring and daytime sleepiness/ fatigue.  Epworth Sleepiness Scale 0= would never doze 1= slight chance of dozing 2= moderate chance of dozing 3= high chance of dozing  Sitting and reading:2 Watching TV:1 Sitting inactive in a public place (ex. Theater or meeting):1 As a passenger in a car for an hour without a break:1 Lying down to rest in the afternoon:0 Sitting and talking to someone:0 Sitting quietly after lunch (no alcohol)0: In a car, while stopped in traffic:0 Total:5     Objective:  Neurological Exam  Physical Exam Physical Examination:   Vitals:   01/25/20 0910  BP: 129/78  Pulse: 60  Temp: (!) 96.8 F (36 C)    General Examination: The patient is a very pleasant 64 y.o. female in no acute distress. She  appears well-developed and well-nourished and well groomed.   HEENT: Normocephalic, atraumatic, pupils are equal, round and reactive to light, extraocular tracking is good without limitation to gaze excursion or nystagmus noted. Hearing is grossly intact. Face is symmetric with normal facial animation. Speech is clear with no dysarthria noted. There is no hypophonia. There is no lip, neck/head, jaw or voice tremor. Neck is supple with full range of passive and active motion. There are no carotid bruits on auscultation. Oropharynx exam reveals: mild mouth dryness, adequate dental hygiene and mild airway crowding, due to redundant soft palate and tonsils of 1-2+ on L and 1+ on R, longer tongue. Mallampati is class II. Tongue protrudes centrally and palate elevates symmetrically. Neck size is 14 7/8.  Chest: Clear to auscultation without wheezing, rhonchi or crackles noted.  Heart: S1+S2+0, regular and normal without murmurs, rubs or gallops noted.   Abdomen: Soft, non-tender and non-distended with normal bowel sounds appreciated on auscultation.  Extremities: There is no pitting edema in the distal lower extremities bilaterally.   Skin: Warm and dry without trophic changes noted.   Musculoskeletal: exam reveals no obvious joint deformities, tenderness or joint swelling or erythema.   Neurologically:  Mental status: The patient is awake, alert and oriented in all 4 spheres. Her immediate and remote memory, attention, language skills and fund of knowledge are appropriate. There is no evidence of aphasia, agnosia, apraxia or anomia. Speech is clear with normal prosody and enunciation. Thought process is linear. Mood is normal and affect is normal.  Cranial nerves II - XII are as described above under HEENT exam.  Motor exam: Normal bulk, strength and tone is noted. There is no tremor, Romberg is negative. Fine motor skills and coordination: grossly intact.  Cerebellar testing: No dysmetria or  intention tremor. There is no truncal or gait ataxia.  Sensory exam: intact to light touch in the upper and lower extremities.  Gait, station and balance: She stands easily. No veering to one side is noted. No leaning to one side is noted. Posture is Davis-appropriate and stance is narrow based. Gait shows normal stride length and normal pace. No problems turning are noted. Tandem walk is unremarkable.                Assessment and Plan:   In summary, Ruth Davis is a very pleasant 64 y.o.-year old female  with an underlying medical history of hyperlipidemia, allergic rhinitis, elevated blood pressure values and mild obesity, whose history and physical exam are concerning for obstructive sleep apnea (OSA). I had a long chat with the patient about my findings and the diagnosis of OSA, its prognosis and treatment options. We talked about medical treatments, surgical interventions and non-pharmacological approaches. I explained in particular the risks and ramifications of untreated moderate to severe OSA, especially with respect to developing cardiovascular disease down the Road, including congestive heart failure, difficult to treat hypertension, cardiac arrhythmias, or stroke. Even type 2 diabetes has, in part, been linked to untreated OSA. Symptoms of untreated OSA include daytime sleepiness, memory problems, mood irritability and mood disorder such as depression and anxiety, lack of energy, as well as recurrent headaches, especially morning headaches. We talked about trying to maintain a healthy lifestyle in general, as well as the importance of weight control. We also talked about the importance of good sleep hygiene. I recommended the following at this time: sleep study.   I explained the sleep test procedure to the patient and also outlined possible surgical and non-surgical treatment options of OSA, including the use of a custom-made dental device (which would require a referral to a specialist  dentist or oral surgeon), upper airway surgical options (which would involve a referral to an ENT surgeon). I also explained the CPAP treatment option to the patient, who indicated that she would be unlikely to try CPAP therapy, as she observes her husband struggling with his machine and the machine is loud and noisy and disturbs her sleep to the point where she sleeps in a different bedroom from time to time.  She believes it would be unlikely for her to tolerate CPAP but she is not completely opposed to considering treatment for sleep apnea and proceeding with a sleep test to rule out obstructive  sleep apnea.  She does report improvement in her headaches and blood pressure numbers since starting amlodipine earlier this month.  I answered all her questions today and the patient was in agreement. I plan to see her back after the sleep study is completed and encouraged her to call with any interim questions, concerns, problems or updates.   Thank you very much for allowing me to participate in the care of this nice patient. If I can be of any further assistance to you please do not hesitate to call me at [phone].  Sincerely,   Huston Foley, MD, PhD

## 2020-01-25 NOTE — Patient Instructions (Addendum)
Thank you for choosing [facility] for your sleep related care! It was nice to meet you today! I appreciate that you entrust me with your sleep related healthcare concerns. I hope, I was able to address at least some of your concerns today, and that I can help you feel reassured and also get better.    Here is what we discussed today and what we came up with as our plan for you:    Based on your symptoms and your exam I believe you may be at some risk for obstructive sleep apnea (aka OSA), and I think we should proceed with a sleep study to determine whether you do or do not have OSA and how severe it is. Even, if you have mild OSA, I may want you to consider treatment with CPAP or autoPAP, as treatment of even borderline or mild sleep apnea can result and improvement of symptoms such as sleep disruption, daytime sleepiness, nighttime bathroom breaks, restless leg symptoms, improvement of headache syndromes, even improved mood disorder.   Please remember, the long-term risks and ramifications of untreated moderate to severe obstructive sleep apnea are: increased Cardiovascular disease, including congestive heart failure, stroke, difficult to control hypertension, treatment resistant obesity, arrhythmias, especially irregular heartbeat commonly known as A. Fib. (atrial fibrillation); even type 2 diabetes has been linked to untreated OSA.   Sleep apnea can cause disruption of sleep and sleep deprivation in most cases, which, in turn, can cause recurrent headaches, problems with memory, mood, concentration, focus, and vigilance. Most people with untreated sleep apnea report excessive daytime sleepiness, which can affect their ability to drive. Please do not drive if you feel sleepy. Patients with sleep apnea developed difficulty initiating and maintaining sleep (aka insomnia).   Having sleep apnea may increase your risk for other sleep disorders, including involuntary behaviors sleep such  as sleep terrors, sleep talking, sleepwalking.    Having sleep apnea can also increase your risk for restless leg syndrome and leg movements at night.   Please note that untreated obstructive sleep apnea may carry additional perioperative morbidity. Patients with significant obstructive sleep apnea (typically, in the moderate to severe degree) should receive, if possible, perioperative PAP (positive airway pressure) therapy and the surgeons and particularly the anesthesiologists should be informed of the diagnosis and the severity of the sleep disordered breathing.   I will likely see you back after your sleep study to go over the test results and where to go from there. We will call you after your sleep study to advise about the results (most likely, you will hear from [place], my nurse) and to set up an appointment at the time, as necessary.    Our sleep lab administrative assistant will call you to schedule your sleep study and give you further instructions, regarding the check in process for the sleep study, arrival time, what to bring, when you can expect to leave after the study, etc., and to answer any other logistical questions you may have. If you don't hear back from her by about 2 weeks from now, please feel free to call her direct line at [phone] or you can call our general clinic number, or email [place] through My Chart.

## 2020-01-27 ENCOUNTER — Ambulatory Visit: Payer: BC Managed Care – PPO | Admitting: Internal Medicine

## 2020-01-27 ENCOUNTER — Telehealth: Payer: Self-pay

## 2020-01-27 ENCOUNTER — Other Ambulatory Visit: Payer: Self-pay

## 2020-01-27 ENCOUNTER — Encounter: Payer: Self-pay | Admitting: Internal Medicine

## 2020-01-27 VITALS — BP 112/78 | HR 59 | Temp 98.3°F | Ht 65.0 in | Wt 184.4 lb

## 2020-01-27 DIAGNOSIS — I10 Essential (primary) hypertension: Secondary | ICD-10-CM | POA: Diagnosis not present

## 2020-01-27 DIAGNOSIS — E6609 Other obesity due to excess calories: Secondary | ICD-10-CM | POA: Diagnosis not present

## 2020-01-27 DIAGNOSIS — R3129 Other microscopic hematuria: Secondary | ICD-10-CM | POA: Diagnosis not present

## 2020-01-27 DIAGNOSIS — Z6830 Body mass index (BMI) 30.0-30.9, adult: Secondary | ICD-10-CM

## 2020-01-27 NOTE — Telephone Encounter (Signed)
LVM for pt to call me back to schedule sleep study  

## 2020-01-27 NOTE — Progress Notes (Signed)
This visit occurred during the SARS-CoV-2 public health emergency.  Safety protocols were in place, including screening questions prior to the visit, additional usage of staff PPE, and extensive cleaning of exam room while observing appropriate contact time as indicated for disinfecting solutions.  Subjective:     Patient ID: Ruth Davis , female    DOB: 1955-12-07 , 64 y.o.   MRN: [id number]   Chief Complaint  Patient presents with  . Hypertension    HPI  She is here today for BP check. She was started on amlodipine 2.5mg  once daily after her last visit. She had been having headaches and her home BP readings were elevated. She reports her headaches have resolved for the most part and she is feeling better. She has not had any issues with the medication. She denies cp, sob and palpitations.   Hypertension This is a new problem. The current episode started 1 to 4 weeks ago. The problem has been gradually improving since onset. The problem is controlled. Pertinent negatives include no blurred vision or chest pain. Past treatments include calcium channel blockers. The current treatment provides moderate improvement.     Past Medical History:  Diagnosis Date  . Allergic rhinitis      Family History  Problem Relation Age of Onset  . Breast cancer Sister   . Breast cancer Maternal Grandmother   . Alzheimer's disease Mother   . Bone cancer Father   . Throat cancer Brother   . Breast cancer Sister   . Colon cancer Sister      Current Outpatient Medications:  .  amLODipine (NORVASC) 2.5 MG tablet, Take 1 tablet (2.5 mg total) by mouth daily. Take with evening meal, Disp: 30 tablet, Rfl: 1   Allergies  Allergen Reactions  . Acetaminophen Other (See Comments)    Hives/swelling  . Fomepizole Other (See Comments)    Hives/swelling     Review of Systems  Constitutional: Negative.   Eyes: Negative for blurred vision.  Respiratory: Negative.   Cardiovascular: Negative.   Negative for chest pain.  Gastrointestinal: Negative.   Neurological: Negative.   Psychiatric/Behavioral: Negative.      Today's Vitals   01/27/20 1406  BP: 112/78  Pulse: (!) 59  Temp: 98.3 F (36.8 C)  TempSrc: Oral  Weight: 184 lb 6.4 oz (83.6 kg)  Height: 5\' 5"  (1.651 m)   Body mass index is 30.69 kg/m.   Objective:  Physical Exam Vitals and nursing note reviewed.  Constitutional:      Appearance: Normal appearance.  HENT:     Head: Normocephalic and atraumatic.  Cardiovascular:     Rate and Rhythm: Normal rate and regular rhythm.     Heart sounds: Normal heart sounds.  Pulmonary:     Effort: Pulmonary effort is normal.     Breath sounds: Normal breath sounds.  Skin:    General: Skin is warm.  Neurological:     General: No focal deficit present.     Mental Status: She is alert.  Psychiatric:        Mood and Affect: Mood normal.        Behavior: Behavior normal.         Assessment And Plan:     1. Essential hypertension, benign  New onset, controlled with amlodipine 2.5mg  once daily. She will rto in six months for re-evaluation. She will continue to monitor her BP 1-2x weekly. She will alert me if she has readings above 140/80.   2. Microscopic hematuria  Renal ultrasound pending.   3. Class 1 obesity due to excess calories without serious comorbidity with body mass index (BMI) of 30.0 to 30.9 in adult  She is encouraged to lose 5-10% of her body weight to decrease cardiac risk. She was congratulated on her lifestyle changes thus far and encouraged to keep up the great work.   [name], MD    THE PATIENT IS ENCOURAGED TO PRACTICE SOCIAL DISTANCING DUE TO THE COVID-19 PANDEMIC.

## 2020-02-03 ENCOUNTER — Ambulatory Visit
Admission: RE | Admit: 2020-02-03 | Discharge: 2020-02-03 | Disposition: A | Discharge status: Home / Self Care | Payer: BC Managed Care – PPO | Source: Ambulatory Visit | Attending: Internal Medicine | Admitting: Internal Medicine

## 2020-02-03 DIAGNOSIS — R3129 Other microscopic hematuria: Secondary | ICD-10-CM

## 2020-02-16 ENCOUNTER — Other Ambulatory Visit: Payer: Self-pay

## 2020-02-16 ENCOUNTER — Ambulatory Visit (INDEPENDENT_AMBULATORY_CARE_PROVIDER_SITE_OTHER): Payer: BC Managed Care – PPO | Admitting: Neurology

## 2020-02-16 DIAGNOSIS — G4733 Obstructive sleep apnea (adult) (pediatric): Secondary | ICD-10-CM | POA: Diagnosis not present

## 2020-02-16 DIAGNOSIS — E669 Obesity, unspecified: Secondary | ICD-10-CM

## 2020-02-16 DIAGNOSIS — G4720 Circadian rhythm sleep disorder, unspecified type: Secondary | ICD-10-CM

## 2020-02-16 DIAGNOSIS — R0683 Snoring: Secondary | ICD-10-CM

## 2020-02-16 DIAGNOSIS — R351 Nocturia: Secondary | ICD-10-CM

## 2020-02-16 DIAGNOSIS — R519 Headache, unspecified: Secondary | ICD-10-CM

## 2020-02-24 NOTE — Addendum Note (Signed)
Addended by: Huston Foley on: 02/24/2020 06:17 PM   Modules accepted: Orders

## 2020-02-24 NOTE — Progress Notes (Signed)
Patient referred by Dr. Allyne Gee, seen by me on 01/25/20, diagnostic PSG on 02/16/20.   Please call and notify the patient that the recent sleep study showed moderate to severe obstructive sleep apnea, with a total AHI of 24.4/hour, REM AHI of 72/hour, supine AHI of 41.2/hour and O2 nadir of 78%. (she had near-absence of REM sleep). I recommend treatment for this in the form of CPAP. This will require a repeat sleep study for proper titration and mask fitting and correct monitoring of the oxygen saturations. Please explain to patient. I have placed an order in the chart. Thanks.  Huston Foley, MD, PhD [facility])

## 2020-02-24 NOTE — Procedures (Signed)
PATIENT'S NAME:  Ruth Davis DOB:      Feb 12, 1956      MR#:    [id number]     DATE OF RECORDING: 02/16/2020 REFERRING M.D.:  Dorothyann Peng, MD Study Performed:   Baseline Polysomnogram HISTORY: 64 year old woman with a history of hyperlipidemia, allergic rhinitis, elevated blood pressure values and mild obesity, who reports snoring and recent elevated blood pressure values as well as morning headaches. The patient endorsed the Epworth Sleepiness Scale at 5 points. The patient's weight 184 pounds with a height of 65 (inches), resulting in a BMI of 30.5 kg/m2. The patient's neck circumference measured 15 inches.  CURRENT MEDICATIONS: Norvasc   PROCEDURE:  This is a multichannel digital polysomnogram utilizing the Somnostar 11.2 system.  Electrodes and sensors were applied and monitored per AASM Specifications.   EEG, EOG, Chin and Limb EMG, were sampled at 200 Hz.  ECG, Snore and Nasal Pressure, Thermal Airflow, Respiratory Effort, CPAP Flow and Pressure, Oximetry was sampled at 50 Hz. Digital video and audio were recorded.      BASELINE STUDY  Lights Out was at 21:47 and Lights On at 04:48.  Total recording time (TRT) was 421.5 minutes, with a total sleep time (TST) of 403 minutes.   The patient's sleep latency was 7 minutes.  REM latency was 79.5 minutes, which is normal. The sleep efficiency was 95.6 %.     SLEEP ARCHITECTURE: WASO (Wake after sleep onset) was 13.5 minutes with minimal sleep fragmentation noted.  There were 8 minutes in Stage N1, 336 minutes Stage N2, 54 minutes Stage N3 and 5 minutes in Stage REM.  The percentage of Stage N1 was 2.%, Stage N2 was 83.4%, which is markedly increased, Stage N3 was 13.4% and Stage R (REM sleep) was 1.2%, which is markedly reduced. The arousals were noted as: 44 were spontaneous, 0 were associated with PLMs, 91 were associated with respiratory events.  RESPIRATORY ANALYSIS:  There were a total of 164 respiratory events:  25 obstructive apneas,  1 central apneas and 14 mixed apneas with a total of 40 apneas and an apnea index (AI) of 6. /hour. There were 124 hypopneas with a hypopnea index of 18.5 /hour. The patient also had 0 respiratory event related arousals (RERAs).      The total APNEA/HYPOPNEA INDEX (AHI) was 24.4/hour and the total RESPIRATORY DISTURBANCE INDEX was  24.4 /hour.  6 events occurred in REM sleep and 255 events in NREM. The REM AHI was  72 /hour, versus a non-REM AHI of 23.8. The patient spent 153 minutes of total sleep time in the supine position and 250 minutes in non-supine.. The supine AHI was 41.2 versus a non-supine AHI of 14.2.  OXYGEN SATURATION & C02:  The Wake baseline 02 saturation was 91%, with the lowest being 78%. Time spent below 89% saturation equaled 70 minutes.   PERIODIC LIMB MOVEMENTS: The patient had a total of 0 Periodic Limb Movements.  The Periodic Limb Movement (PLM) index was 0 and the PLM Arousal index was 0/hour.  Audio and video analysis did not show any abnormal or unusual movements, behaviors, phonations or vocalizations. The patient took no bathroom breaks. Mild to moderate snoring was noted. The EKG was in keeping with normal sinus rhythm (NSR).  Post-study, the patient indicated that sleep was the same as usual.   IMPRESSION:  1. Obstructive Sleep Apnea (OSA) 2. Dysfunctions associated with sleep stages or arousal from sleep  RECOMMENDATIONS:  1. This study demonstrates moderate to severe obstructive  sleep apnea, with a total AHI of 24.4/hour, REM AHI of 72/hour, supine AHI of 41.2/hour and O2 nadir of 78%. The near-absence of REM sleep likely underestimates her sleep disordered breathing. Treatment with positive airway pressure in the form of CPAP is recommended. This will require a full night titration study to optimize therapy. Other treatment options may include avoidance of supine sleep position along with weight loss, upper airway or jaw surgery in selected patients or the use of  an oral appliance in certain patients. ENT evaluation and/or consultation with a maxillofacial surgeon or dentist may be feasible in some instances.    2. Please note that untreated obstructive sleep apnea may carry additional perioperative morbidity. Patients with significant obstructive sleep apnea should receive perioperative PAP therapy and the surgeons and particularly the anesthesiologist should be informed of the diagnosis and the severity of the sleep disordered breathing. 3. This study shows some sleep fragmentation and abnormal sleep stage percentages; these are nonspecific findings and per se do not signify an intrinsic sleep disorder or a cause for the patient's sleep-related symptoms. Causes include (but are not limited to) the first night effect of the sleep study, circadian rhythm disturbances, medication effect or an underlying mood disorder or medical problem.  4. The patient should be cautioned not to drive, work at heights, or operate dangerous or heavy equipment when tired or sleepy. Review and reiteration of good sleep hygiene measures should be pursued with any patient. 5. The patient will be seen in follow-up in the sleep clinic at [facility] for discussion of the test results, symptom and treatment compliance review, further management strategies, etc. The referring provider will be notified of the test results.  I certify that I have reviewed the entire raw data recording prior to the issuance of this report in accordance with the Standards of Accreditation of the American Academy of Sleep Medicine (AASM)  Star Age, MD, PhD Diplomat, American Board of Neurology and Sleep Medicine (Neurology and Sleep Medicine)

## 2020-02-29 ENCOUNTER — Other Ambulatory Visit: Payer: Self-pay | Admitting: Internal Medicine

## 2020-03-01 ENCOUNTER — Telehealth: Payer: Self-pay

## 2020-03-01 NOTE — Telephone Encounter (Signed)
I called pt. No answer, left a message asking pt to call me back.   

## 2020-03-01 NOTE — Telephone Encounter (Signed)
-----   Message from Huston Foley, MD sent at 02/24/2020  6:17 PM EDT ----- Patient referred by Dr. Allyne Gee, seen by me on 01/25/20, diagnostic PSG on 02/16/20.   Please call and notify the patient that the recent sleep study showed moderate to severe obstructive sleep apnea, with a total AHI of 24.4/hour, REM AHI of 72/hour, supine AHI of 41.2/hour and O2 nadir of 78%. (she had near-absence of REM sleep). I recommend treatment for this in the form of CPAP. This will require a repeat sleep study for proper titration and mask fitting and correct monitoring of the oxygen saturations. Please explain to patient. I have placed an order in the chart. Thanks.  Huston Foley, MD, PhD [facility])

## 2020-03-02 ENCOUNTER — Telehealth: Payer: Self-pay

## 2020-03-02 NOTE — Telephone Encounter (Signed)
LVM for pt to call me back to schedule sleep study  

## 2020-03-06 ENCOUNTER — Telehealth: Payer: Self-pay

## 2020-03-06 NOTE — Telephone Encounter (Signed)
Noted. I will call the pt back to further discuss this message.

## 2020-03-06 NOTE — Telephone Encounter (Signed)
Called patient to schedule CPAP study. Pt is not interested in CPAP. Pt doesn't feel that she truly has OSA or that it is as bad as the study showed. She stated she had a terrible night with not being able to move due to all the wires or felt like going to the restroom was an option (even though the tech explained everything well and made patient comfortable per patient stating so).  Pt is interested in scheduling a face to face visit with Dr. Frances Furbish to explain everything in detail and discuss other options than CPAP.

## 2020-03-08 NOTE — Telephone Encounter (Signed)
I reached back out to the pt. Pt is requesting an appt with Dr. Frances Furbish to further discuss her sleep study results. She is adamant at this time she does not want to pursue cpap.  Pt scheduled for f/u in July to see Dr. Frances Furbish and we will go from there.

## 2020-04-04 ENCOUNTER — Other Ambulatory Visit: Payer: Self-pay

## 2020-04-04 ENCOUNTER — Encounter: Payer: Self-pay | Admitting: Neurology

## 2020-04-04 ENCOUNTER — Ambulatory Visit: Payer: BC Managed Care – PPO | Admitting: Neurology

## 2020-04-04 VITALS — BP 126/84 | HR 65 | Ht 65.0 in | Wt 180.3 lb

## 2020-04-04 DIAGNOSIS — G4733 Obstructive sleep apnea (adult) (pediatric): Secondary | ICD-10-CM | POA: Diagnosis not present

## 2020-04-04 NOTE — Patient Instructions (Signed)
As discussed, we will wait for you to see your primary care physician and then get in touch with [place] regarding your sleep apnea treatment options.   Untreated obstructive sleep apnea when it is moderate to severe can have an adverse impact on cardiovascular health and raise her risk for heart disease, arrhythmias, hypertension, congestive heart failure, stroke and diabetes. Untreated obstructive sleep apnea causes sleep disruption, nonrestorative sleep, and sleep deprivation. This can have an impact on your day to day functioning and cause daytime sleepiness and impairment of cognitive function, memory loss, mood disturbance, and problems focussing. Using PAP regularly can improve these symptoms.  Ultimately, I would not recommend, that we leave you untreated.  Ideally, I would like for you to consider coming in for your second sleep study for CPAP titration, we can also consider autoPAP therapy by prescribing a machine for treatment at home and follow you clinically as well as monitor data 3 machine as far as treatment of your sleep apnea.   We can also look into referrals to ENT and/or Dentistry for alternative treatment options, as discussed. You can talk to your own dentist about getting an oral appliance made. If you want to seek consultation with an ENT surgeon about surgical options, I can make a referral. If your dentist does not provide treatment for OSA, I can make a referral to a dentist locally.

## 2020-04-04 NOTE — Progress Notes (Signed)
Subjective:    Patient ID: Ruth Davis is a 64 y.o. female.  HPI     Interim history:   Ruth Davis is a 64 year old right-handed woman with an underlying medical history of hyperlipidemia, allergic rhinitis, elevated blood pressure values and mild obesity, who Presents for follow-up consultation of her obstructive sleep apnea, after sleep testing.  She requested a follow-up appointment to discuss test results and where to go from here.  She had been advised to proceed with a second sleep study for CPAP but did not wish to proceed.  I first met her on 01/25/2020 at the request of her primary care physician, at which time she reported morning headaches and elevated blood pressure values.  She was advised to proceed with sleep study testing.  She had a baseline sleep study on 02/16/2020 which showed a very good sleep efficiency of 95.6%, REM latency normal at 79.5 minutes, sleep latency 7 minutes.  She had a normal percentage of stage I sleep, and increased percentage of stage II sleep, a fairly normal percentage of slow-wave sleep and near absence of REM sleep at 1.2%.  She had a total AHI of 24.4/h, REM AHI 72/h, supine AHI 41.2/h.  Average oxygen saturation was 91%, nadir was 78%. She had no significant PLM's, EEG or EKG changes.  Today, 04/04/20: she reports that she is working on weight loss.  She would like to wait till her appointment with Dr. Baird Cancer to consider treatment for sleep apnea.  She is planning to retire by the end of this year.  She is hoping that she will achieve desired weight loss by the time she has a follow-up with her primary care physician.  The patient's allergies, current medications, family history, past medical history, past social history, past surgical history and problem list were reviewed and updated as appropriate.   Previously:    01/25/20: (She) reports snoring and recent elevated blood pressure values as well as morning headaches.  She reports that  since she was started on amlodipine her blood pressure numbers have improved and her morning headaches have also improved.  She has nocturia about twice per average night.  I reviewed your office note from 01/06/2020.  Her Epworth sleepiness score is 5/24, fatigue severity score is 11 out of 63.  She is familiar with a sleep apnea diagnosis, her sister has a CPAP machine and her husband also uses CPAP.  However, he struggles with his CPAP and sleeps with interruptions and his CPAP makes a noise which is bothersome to the patient.  She does not foresee that she would be able to tolerate CPAP therapy, nevertheless, she is willing to get tested for sleep apnea.  Her bedtime is generally between 11 and 11:30 PM, rise time is typically between 5 and 5:30 AM.  Prior to the pandemic she is to go to the gym 3 times a week in the mornings.  She was recently started on low-dose amlodipine, 2.5 mg strength daily since earlier this month.  She drinks caffeine in the form of tea, 1 or 2 servings per day, she is a non-smoker and drinks alcohol rarely, maybe 2 or 3 times a month.  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  She has 1 son, 1 daughter.  She has 1 granddaughter, age 64 months.  She denies telltale symptoms of restless leg syndrome or leg twitching at night.  She has occasional sleep  talking per husband's report.  Her Past Medical History Is Significant For: Past Medical History:  Diagnosis Date  . Allergic rhinitis     Her Past Surgical History Is Significant For: Past Surgical History:  Procedure Laterality Date  . REDUCTION MAMMAPLASTY Bilateral 2008    Her Family History Is Significant For: Family History  Problem Relation Age of Onset  . Breast cancer Sister   . Breast cancer Maternal Grandmother   . Alzheimer's disease Mother   . Bone cancer Father   . Throat cancer Brother   . Breast cancer Sister    . Colon cancer Sister     Her Social History Is Significant For: Social History   Socioeconomic History  . Marital status: Married    Spouse name: Not on file  . Number of children: Not on file  . Years of education: Not on file  . Highest education level: Not on file  Occupational History  . Not on file  Tobacco Use  . Smoking status: Never Smoker  . Smokeless tobacco: Never Used  Vaping Use  . Vaping Use: Never used  Substance and Sexual Activity  . Alcohol use: Never  . Drug use: Never  . Sexual activity: Not on file  Other Topics Concern  . Not on file  Social History Narrative  . Not on file   Social Determinants of Health   Financial Resource Strain:   . Difficulty of Paying Living Expenses:   Food Insecurity:   . Worried About Charity fundraiser in the Last Year:   . Arboriculturist in the Last Year:   Transportation Needs:   . Film/video editor (Medical):   Marland Kitchen Lack of Transportation (Non-Medical):   Physical Activity:   . Days of Exercise per Week:   . Minutes of Exercise per Session:   Stress:   . Feeling of Stress :   Social Connections:   . Frequency of Communication with Friends and Family:   . Frequency of Social Gatherings with Friends and Family:   . Attends Religious Services:   . Active Member of Clubs or Organizations:   . Attends Archivist Meetings:   Marland Kitchen Marital Status:     Her Allergies Are:  Allergies  Allergen Reactions  . Acetaminophen Other (See Comments)    Hives/swelling  . Fomepizole Other (See Comments)    Hives/swelling  :   Her Current Medications Are:  Outpatient Encounter Medications as of 04/04/2020  Medication Sig  . amLODipine (NORVASC) 2.5 MG tablet TAKE 1 TABLET (2.5 MG TOTAL) BY MOUTH DAILY. TAKE WITH EVENING MEAL   No facility-administered encounter medications on file as of 04/04/2020.  :  Review of Systems:  Out of a complete 14 point review of systems, all are reviewed and negative with the  exception of these symptoms as listed below: Review of Systems  Neurological:       Here to f/u on most recent sleep study. Pt wanted to review results/concerns with MD.    Objective:  Neurological Exam  Physical Exam Physical Examination:   Vitals:   04/04/20 1019  BP: 126/84  Pulse: 65    General Examination: The patient is a very pleasant 64 y.o. female in no acute distress. She appears well-developed and well-nourished and well groomed.   HEENT: Normocephalic, atraumatic, pupils are equal, round and reactive to light, tracking is well preserved, hearing grossly intact, face is symmetric with normal facial animation, speech is clear, no dysarthria,  hypophonia or voice tremor.  Oropharynx exam reveals: mild mouth dryness, adequate dental hygiene and mild airway crowding. Tongue protrudes centrally and palate elevates symmetrically.   She has a minimal overbite.  Chest: Clear to auscultation without wheezing, rhonchi or crackles noted.  Heart: S1+S2+0, regular and normal without murmurs, rubs or gallops noted.   Abdomen: Soft, non-tender and non-distended.  Extremities: There is no  his edema.    Skin: Warm and dry without trophic changes noted.   Musculoskeletal: exam reveals no obvious joint deformities, tenderness or joint swelling or erythema.   Neurologically:  Mental status: The patient is awake, alert and oriented in all 4 spheres. Her immediate and remote memory, attention, language skills and fund of knowledge are appropriate. There is no evidence of aphasia, agnosia, apraxia or anomia. Speech is clear with normal prosody and enunciation. Thought process is linear. Mood is normal and affect is normal.  Cranial nerves II - XII are as described above under HEENT exam.  Motor exam: Normal bulk, strength and tone is noted. There is no tremor, fine motor skills and coordination: grossly intact.  Cerebellar testing: No dysmetria or intention tremor. There is no  truncal or gait ataxia.  Sensory exam: intact to light touch in the upper and lower extremities.   Assessment and Plan:   In summary, Dealie Koelzer is a very pleasant 64 year old female  with an underlying medical history of hyperlipidemia, allergic rhinitis, elevated blood pressure values and borderline obesity, who presents for follow-up consultation of her obstructive sleep apnea after the baseline sleep study on 02/16/2020.  Her sleep study indicated moderate to severe obstructive sleep apnea with an AHI of 24.4/h, minimal REM sleep achieved, O2 nadir of 78%.  We talked about her sleep study results in detail today.  We talked about treatment options at length as well.  She would like to hold off on positive airway pressure treatment.  Ideally, I would like for her to come in for a second sleep study for CPAP titration but we can also consider AutoPap therapy I explained to her.  I again advised her regarding the risks and ramifications of untreated moderate to severe obstructive sleep apnea.  I would not recommend that she leave it untreated.  She is working on weight loss which is good.  Nevertheless, a treatment option with positive airway pressure treatment or dental option should be considered.  She is going to discuss this with her primary care physician as well.  She is advised to let us know how she would like to proceed, I can make a referral to dentistry as well as to ENT but I would not favor any surgical option as her first-line.  She may not be a good candidate for a dental device but we can certainly seek an opinion from a dentist.  If she changes her mind regarding treatment with a CPAP or AutoPap she is going to let us know.  I answered all her questions today and she was in agreement with the plan, for now I plan to see her back as needed.   I spent 30 minutes in total face-to-face time and in reviewing records during pre-charting, more than 50% of which was spent in counseling and  coordination of care, reviewing test results, reviewing medications and treatment regimen and/or in discussing or reviewing the diagnosis of OSA, the prognosis and treatment options. Pertinent laboratory and imaging test results that were available during this visit with the patient were reviewed by me  and considered in my medical decision making (see chart for details).

## 2020-04-26 ENCOUNTER — Other Ambulatory Visit: Payer: Self-pay | Admitting: Internal Medicine

## 2020-05-11 ENCOUNTER — Encounter: Payer: Self-pay | Admitting: Internal Medicine

## 2020-06-29 ENCOUNTER — Other Ambulatory Visit: Payer: Self-pay | Admitting: Internal Medicine

## 2020-06-30 ENCOUNTER — Other Ambulatory Visit: Payer: Self-pay | Admitting: Internal Medicine

## 2020-06-30 DIAGNOSIS — Z0000 Encounter for general adult medical examination without abnormal findings: Secondary | ICD-10-CM

## 2020-07-05 ENCOUNTER — Ambulatory Visit (INDEPENDENT_AMBULATORY_CARE_PROVIDER_SITE_OTHER): Payer: BC Managed Care – PPO | Admitting: Internal Medicine

## 2020-07-05 ENCOUNTER — Other Ambulatory Visit (HOSPITAL_COMMUNITY)
Admission: RE | Admit: 2020-07-05 | Discharge: 2020-07-05 | Disposition: A | Discharge status: Home / Self Care | Payer: BC Managed Care – PPO | Source: Ambulatory Visit | Attending: Internal Medicine | Admitting: Internal Medicine

## 2020-07-05 ENCOUNTER — Other Ambulatory Visit: Payer: Self-pay

## 2020-07-05 ENCOUNTER — Encounter: Payer: Self-pay | Admitting: Internal Medicine

## 2020-07-05 VITALS — BP 126/78 | HR 68 | Temp 98.1°F | Ht 64.2 in | Wt 180.8 lb

## 2020-07-05 DIAGNOSIS — Z0000 Encounter for general adult medical examination without abnormal findings: Secondary | ICD-10-CM

## 2020-07-05 DIAGNOSIS — E7800 Pure hypercholesterolemia, unspecified: Secondary | ICD-10-CM

## 2020-07-05 DIAGNOSIS — Z01419 Encounter for gynecological examination (general) (routine) without abnormal findings: Secondary | ICD-10-CM | POA: Diagnosis not present

## 2020-07-05 DIAGNOSIS — Z6830 Body mass index (BMI) 30.0-30.9, adult: Secondary | ICD-10-CM

## 2020-07-05 DIAGNOSIS — I10 Essential (primary) hypertension: Secondary | ICD-10-CM

## 2020-07-05 DIAGNOSIS — E6609 Other obesity due to excess calories: Secondary | ICD-10-CM

## 2020-07-05 DIAGNOSIS — Z23 Encounter for immunization: Secondary | ICD-10-CM | POA: Diagnosis not present

## 2020-07-05 DIAGNOSIS — E66811 Obesity, class 1: Secondary | ICD-10-CM

## 2020-07-05 LAB — CMP14+EGFR
ALT: 19 IU/L (ref 0–32)
AST: 26 IU/L (ref 0–40)
Albumin/Globulin Ratio: 1.3 (ref 1.2–2.2)
Albumin: 4.1 g/dL (ref 3.8–4.8)
Alkaline Phosphatase: 60 IU/L (ref 44–121)
BUN/Creatinine Ratio: 15 (ref 12–28)
BUN: 13 mg/dL (ref 8–27)
Bilirubin Total: 0.4 mg/dL (ref 0.0–1.2)
CO2: 27 mmol/L (ref 20–29)
Calcium: 9.4 mg/dL (ref 8.7–10.3)
Chloride: 102 mmol/L (ref 96–106)
Creatinine, Ser: 0.85 mg/dL (ref 0.57–1.00)
GFR calc Af Amer: 84 mL/min/{1.73_m2} (ref >59)
GFR calc non Af Amer: 73 mL/min/{1.73_m2} (ref >59)
Globulin, Total: 3.2 g/dL (ref 1.5–4.5)
Glucose: 83 mg/dL (ref 65–99)
Potassium: 4.9 mmol/L (ref 3.5–5.2)
Sodium: 141 mmol/L (ref 134–144)
Total Protein: 7.3 g/dL (ref 6.0–8.5)

## 2020-07-05 LAB — POCT URINALYSIS DIPSTICK
Bilirubin, UA: NEGATIVE
Blood, UA: NEGATIVE
Glucose, UA: NEGATIVE
Ketones, UA: NEGATIVE
Leukocytes, UA: NEGATIVE
Nitrite, UA: NEGATIVE
Protein, UA: NEGATIVE
Spec Grav, UA: 1.02 (ref 1.010–1.025)
Urobilinogen, UA: 0.2 E.U./dL
pH, UA: 5.5 (ref 5.0–8.0)

## 2020-07-05 LAB — CBC
Hematocrit: 40.4 % (ref 34.0–46.6)
Hemoglobin: 13.7 g/dL (ref 11.1–15.9)
MCH: 31.9 pg (ref 26.6–33.0)
MCHC: 33.9 g/dL (ref 31.5–35.7)
MCV: 94 fL (ref 79–97)
Platelets: 267 10*3/uL (ref 150–450)
RBC: 4.3 x10E6/uL (ref 3.77–5.28)
RDW: 12.9 % (ref 11.7–15.4)
WBC: 3.6 10*3/uL (ref 3.4–10.8)

## 2020-07-05 LAB — LIPID PANEL
Chol/HDL Ratio: 4.1 ratio (ref 0.0–4.4)
Cholesterol, Total: 267 mg/dL — ABNORMAL HIGH (ref 100–199)
HDL: 65 mg/dL (ref >39)
LDL Chol Calc (NIH): 187 mg/dL — ABNORMAL HIGH (ref 0–99)
Triglycerides: 90 mg/dL (ref 0–149)
VLDL Cholesterol Cal: 15 mg/dL (ref 5–40)

## 2020-07-05 LAB — POCT UA - MICROALBUMIN
Albumin/Creatinine Ratio, Urine, POC: <30
Creatinine, POC: 50 mg/dL
Microalbumin Ur, POC: 10 mg/L

## 2020-07-05 LAB — HEMOGLOBIN A1C
Est. average glucose Bld gHb Est-mCnc: 117 mg/dL
Hgb A1c MFr Bld: 5.7 % — ABNORMAL HIGH (ref 4.8–5.6)

## 2020-07-05 MED ORDER — AMLODIPINE BESYLATE 2.5 MG PO TABS: 2.5000 mg | ORAL_TABLET | Freq: Every day | ORAL | 2 refills | Status: DC

## 2020-07-05 NOTE — Progress Notes (Signed)
I,Katawbba Wiggins,acting as a Education administrator for [name], MD.,have documented all relevant documentation on the behalf of [name], MD,as directed by  [name], MD while in the presence of [name], MD.  This visit occurred during the SARS-CoV-2 public health emergency.  Safety protocols were in place, including screening questions prior to the visit, additional usage of staff PPE, and extensive cleaning of exam room while observing appropriate contact time as indicated for disinfecting solutions.  Subjective:     Patient ID: Ruth Davis , female    DOB: 1955/10/07 , 64 y.o.   MRN: [id number]   Chief Complaint  Patient presents with  . Annual Exam  . Hypertension    HPI  She is here today for a full physical exam. She had her last pap smear with me 3 years ago. She has no specific concerns or complaints at this time.  Hypertension This is a new problem. The problem has been gradually improving since onset. The problem is controlled. Pertinent negatives include no blurred vision or chest pain. Past treatments include calcium channel blockers. The current treatment provides moderate improvement.     Past Medical History:  Diagnosis Date  . Allergic rhinitis   . Essential hypertension, benign      Family History  Problem Relation Age of Onset  . Breast cancer Sister   . Breast cancer Maternal Grandmother   . Alzheimer's disease Mother   . Bone cancer Father   . Throat cancer Brother   . Breast cancer Sister   . Colon cancer Sister      Current Outpatient Medications:  .  amLODipine (NORVASC) 2.5 MG tablet, Take 1 tablet (2.5 mg total) by mouth daily. Take with evening meal, Disp: 90 tablet, Rfl: 2   Allergies  Allergen Reactions  . Acetaminophen Other (See Comments)    Hives/swelling  . Fomepizole Other (See Comments)    Hives/swelling      The patient states she uses post menopausal status for birth control. Last LMP was No LMP  recorded. Patient is postmenopausal.. Negative for Dysmenorrhea. Negative for: breast discharge, breast lump(s), breast pain and breast self exam. Associated symptoms include abnormal vaginal bleeding. Pertinent negatives include abnormal bleeding (hematology), anxiety, decreased libido, depression, difficulty falling sleep, dyspareunia, history of infertility, nocturia, sexual dysfunction, sleep disturbances, urinary incontinence, urinary urgency, vaginal discharge and vaginal itching. Diet regular.The patient states her exercise level is  moderate.  . The patient's tobacco use is:  Social History   Tobacco Use  Smoking Status Never Smoker  Smokeless Tobacco Never Used  . She has been exposed to passive smoke. The patient's alcohol use is:  Social History   Substance and Sexual Activity  Alcohol Use Never    Review of Systems  Constitutional: Negative.   HENT: Negative.   Eyes: Negative.  Negative for blurred vision.  Respiratory: Negative.   Cardiovascular: Negative.  Negative for chest pain.  Gastrointestinal: Negative.   Endocrine: Negative.   Genitourinary: Negative.   Musculoskeletal: Negative.   Skin: Negative.   Allergic/Immunologic: Negative.   Neurological: Negative.   Hematological: Negative.   Psychiatric/Behavioral: Negative.      Today's Vitals   07/05/20 0949  BP: 126/78  Pulse: 68  Temp: 98.1 F (36.7 C)  TempSrc: Oral  Weight: 180 lb 12.8 oz (82 kg)  Height: 5' 4.2" (1.631 m)   Body mass index is 30.84 kg/m.  Wt Readings from Last 3 Encounters:  07/05/20 180 lb 12.8 oz (  82 kg)  04/04/20 180 lb 5 oz (81.8 kg)  01/27/20 184 lb 6.4 oz (83.6 kg)   Objective:  Physical Exam Exam conducted with a chaperone present.  Constitutional:      General: She is not in acute distress.    Appearance: Normal appearance. She is well-developed. She is obese.  HENT:     Head: Normocephalic and atraumatic.     Right Ear: Hearing, tympanic membrane, ear canal and  external ear normal. There is no impacted cerumen.     Left Ear: Hearing, tympanic membrane, ear canal and external ear normal. There is no impacted cerumen.     Nose:     Comments: Deferred, masked    Mouth/Throat:     Comments: Deferred, masked Eyes:     General: Lids are normal.     Extraocular Movements: Extraocular movements intact.     Conjunctiva/sclera: Conjunctivae normal.     Pupils: Pupils are equal, round, and reactive to light.     Funduscopic exam:    Right eye: No papilledema.        Left eye: No papilledema.  Neck:     Thyroid: No thyroid mass.     Vascular: No carotid bruit.  Cardiovascular:     Rate and Rhythm: Normal rate and regular rhythm.     Pulses: Normal pulses.     Heart sounds: Normal heart sounds. No murmur heard.   Pulmonary:     Effort: Pulmonary effort is normal.     Breath sounds: Normal breath sounds.  Chest:     Breasts: Tanner Score is 5.        Right: Normal.        Left: Normal.  Abdominal:     General: Abdomen is flat. Bowel sounds are normal. There is no distension.     Palpations: Abdomen is soft.     Tenderness: There is no abdominal tenderness.  Genitourinary:    General: Normal vulva.     Exam position: Lithotomy position.     Tanner stage (genital): 5.     Vagina: Normal.     Cervix: Normal.     Uterus: Normal.      Adnexa: Right adnexa normal and left adnexa normal.     Rectum: Guaiac result negative.  Musculoskeletal:        General: No swelling. Normal range of motion.     Cervical back: Full passive range of motion without pain, normal range of motion and neck supple.     Right lower leg: No edema.     Left lower leg: No edema.  Skin:    General: Skin is warm and dry.     Capillary Refill: Capillary refill takes less than 2 seconds.  Neurological:     General: No focal deficit present.     Mental Status: She is alert and oriented to person, place, and time.     Cranial Nerves: No cranial nerve deficit.     Sensory:  No sensory deficit.  Psychiatric:        Mood and Affect: Mood normal.        Behavior: Behavior normal.        Thought Content: Thought content normal.        Judgment: Judgment normal.         Assessment And Plan:     1. Routine general medical examination at a health care facility Comments: A full exam was performed. Importance of monthly self breast exams was  discussed with the patient.  PATIENT IS ADVISED TO GET 30-45 MINUTES REGULAR EXERCISE NO LESS THAN FOUR TO FIVE DAYS PER WEEK - BOTH WEIGHTBEARING EXERCISES AND AEROBIC ARE RECOMMENDED.  PATIENT IS ADVISED TO FOLLOW A HEALTHY DIET WITH AT LEAST SIX FRUITS/VEGGIES PER DAY, DECREASE INTAKE OF RED MEAT, AND TO INCREASE FISH INTAKE TO TWO DAYS PER WEEK.  MEATS/FISH SHOULD NOT BE FRIED, BAKED OR BROILED IS PREFERABLE.  I SUGGEST WEARING SPF 50 SUNSCREEN ON EXPOSED PARTS AND ESPECIALLY WHEN IN THE DIRECT SUNLIGHT FOR AN EXTENDED PERIOD OF TIME.  PLEASE AVOID FAST FOOD RESTAURANTS AND INCREASE YOUR WATER INTAKE.  - Hemoglobin A1c - CBC - CMP14+EGFR - Lipid panel  2. Encounter for gynecological examination without abnormal finding Comments: Pap smear performed. Stool heme negative.  - Cytology -Pap Smear  3. Essential hypertension, benign Comments: Chronic, well controlled. She will continue with current meds. EKG performed, NSR w/o acute changes. She is encouraged to avoid adding salt to her foods. She will rto in six months for re-evaluation. - POCT Urinalysis Dipstick (81002) - POCT UA - Microalbumin - EKG 12-Lead  4. Pure hypercholesterolemia Comments: Chronic, previous results reviewed in full detail. Pt advised of LDL goal less than 70. She does not wish to start rx meds; however, she will consider supplements such as RYR, niacin etc.   5. Need for vaccination Comments: She was given flu vaccine.  - Flu Vaccine QUAD 6+ mos PF IM (Fluarix Quad PF)  6. Class 1 obesity due to excess calories without serious comorbidity with  body mass index (BMI) of 30.0 to 30.9 in adult Comments: She is encouraged to aim for at least 150 minutes of exercise per week.      Patient was given opportunity to ask questions. Patient verbalized understanding of the plan and was able to repeat key elements of the plan. All questions were answered to their satisfaction.   [name], MD   I, [name], MD, have reviewed all documentation for this visit. The documentation on 07/07/20 for the exam, diagnosis, procedures, and orders are all accurate and complete.  THE PATIENT IS ENCOURAGED TO PRACTICE SOCIAL DISTANCING DUE TO THE COVID-19 PANDEMIC.

## 2020-07-05 NOTE — Patient Instructions (Signed)
Health Maintenance, Female Adopting a healthy lifestyle and getting preventive care are important in promoting health and wellness. Ask your health care provider about:  The right schedule for you to have regular tests and exams.  Things you can do on your own to prevent diseases and keep yourself healthy. What should I know about diet, weight, and exercise? Eat a healthy diet   Eat a diet that includes plenty of vegetables, fruits, low-fat dairy products, and lean protein.  Do not eat a lot of foods that are high in solid fats, added sugars, or sodium. Maintain a healthy weight Body mass index (BMI) is used to identify weight problems. It estimates body fat based on height and weight. Your health care provider can help determine your BMI and help you achieve or maintain a healthy weight. Get regular exercise Get regular exercise. This is one of the most important things you can do for your health. Most adults should:  Exercise for at least 150 minutes each week. The exercise should increase your heart rate and make you sweat (moderate-intensity exercise).  Do strengthening exercises at least twice a week. This is in addition to the moderate-intensity exercise.  Spend less time sitting. Even light physical activity can be beneficial. Watch cholesterol and blood lipids Have your blood tested for lipids and cholesterol at 64 years of age, then have this test every 5 years. Have your cholesterol levels checked more often if:  Your lipid or cholesterol levels are high.  You are older than 64 years of age.  You are at high risk for heart disease. What should I know about cancer screening? Depending on your health history and family history, you may need to have cancer screening at various ages. This may include screening for:  Breast cancer.  Cervical cancer.  Colorectal cancer.  Skin cancer.  Lung cancer. What should I know about heart disease, diabetes, and high blood  pressure? Blood pressure and heart disease  High blood pressure causes heart disease and increases the risk of stroke. This is more likely to develop in people who have high blood pressure readings, are of African descent, or are overweight.  Have your blood pressure checked: ? Every 3-5 years if you are 18-39 years of age. ? Every year if you are 40 years old or older. Diabetes Have regular diabetes screenings. This checks your fasting blood sugar level. Have the screening done:  Once every three years after age 40 if you are at a normal weight and have a low risk for diabetes.  More often and at a younger age if you are overweight or have a high risk for diabetes. What should I know about preventing infection? Hepatitis B If you have a higher risk for hepatitis B, you should be screened for this virus. Talk with your health care provider to find out if you are at risk for hepatitis B infection. Hepatitis C Testing is recommended for:  Everyone born from 1945 through 1965.  Anyone with known risk factors for hepatitis C. Sexually transmitted infections (STIs)  Get screened for STIs, including gonorrhea and chlamydia, if: ? You are sexually active and are younger than 64 years of age. ? You are older than 64 years of age and your health care provider tells you that you are at risk for this type of infection. ? Your sexual activity has changed since you were last screened, and you are at increased risk for chlamydia or gonorrhea. Ask your health care provider if   you are at risk.  Ask your health care provider about whether you are at high risk for HIV. Your health care provider may recommend a prescription medicine to help prevent HIV infection. If you choose to take medicine to prevent HIV, you should first get tested for HIV. You should then be tested every 3 months for as long as you are taking the medicine. Pregnancy  If you are about to stop having your period (premenopausal) and  you may become pregnant, seek counseling before you get pregnant.  Take 400 to 800 micrograms (mcg) of folic acid every day if you become pregnant.  Ask for birth control (contraception) if you want to prevent pregnancy. Osteoporosis and menopause Osteoporosis is a disease in which the bones lose minerals and strength with aging. This can result in bone fractures. If you are 65 years old or older, or if you are at risk for osteoporosis and fractures, ask your health care provider if you should:  Be screened for bone loss.  Take a calcium or vitamin D supplement to lower your risk of fractures.  Be given hormone replacement therapy (HRT) to treat symptoms of menopause. Follow these instructions at home: Lifestyle  Do not use any products that contain nicotine or tobacco, such as cigarettes, e-cigarettes, and chewing tobacco. If you need help quitting, ask your health care provider.  Do not use street drugs.  Do not share needles.  Ask your health care provider for help if you need support or information about quitting drugs. Alcohol use  Do not drink alcohol if: ? Your health care provider tells you not to drink. ? You are pregnant, may be pregnant, or are planning to become pregnant.  If you drink alcohol: ? Limit how much you use to 0-1 drink a day. ? Limit intake if you are breastfeeding.  Be aware of how much alcohol is in your drink. In the [place] one drink equals one 12 oz bottle of beer (355 mL), one 5 oz glass of wine (148 mL), or one 1 oz glass of hard liquor (44 mL). General instructions  Schedule regular health, dental, and eye exams.  Stay current with your vaccines.  Tell your health care provider if: ? You often feel depressed. ? You have ever been abused or do not feel safe at home. Summary  Adopting a healthy lifestyle and getting preventive care are important in promoting health and wellness.  Follow your health care provider's instructions about healthy  diet, exercising, and getting tested or screened for diseases.  Follow your health care provider's instructions on monitoring your cholesterol and blood pressure. This information is not intended to replace advice given to you by your health care provider. Make sure you discuss any questions you have with your health care provider. Document Revised: 09/09/2018 Document Reviewed: 09/09/2018 Elsevier Patient Education  2020 [employer]  

## 2020-07-07 ENCOUNTER — Encounter: Payer: Self-pay | Admitting: Internal Medicine

## 2020-07-07 LAB — CYTOLOGY - PAP
Comment: NEGATIVE
Diagnosis: NEGATIVE
High risk HPV: NEGATIVE

## 2020-07-31 ENCOUNTER — Ambulatory Visit: Payer: BC Managed Care – PPO | Attending: Family

## 2020-07-31 DIAGNOSIS — Z23 Encounter for immunization: Secondary | ICD-10-CM

## 2020-08-14 ENCOUNTER — Ambulatory Visit: Payer: BC Managed Care – PPO

## 2020-08-14 ENCOUNTER — Encounter: Payer: Self-pay | Admitting: Internal Medicine

## 2020-08-14 ENCOUNTER — Other Ambulatory Visit: Payer: Self-pay

## 2020-08-14 ENCOUNTER — Ambulatory Visit: Payer: BC Managed Care – PPO | Admitting: Internal Medicine

## 2020-08-14 VITALS — BP 114/72 | HR 60 | Temp 98.2°F | Ht 64.2 in | Wt 180.8 lb

## 2020-08-14 DIAGNOSIS — I10 Essential (primary) hypertension: Secondary | ICD-10-CM

## 2020-08-14 DIAGNOSIS — E7800 Pure hypercholesterolemia, unspecified: Secondary | ICD-10-CM | POA: Diagnosis not present

## 2020-08-14 DIAGNOSIS — E6609 Other obesity due to excess calories: Secondary | ICD-10-CM

## 2020-08-14 DIAGNOSIS — Z6830 Body mass index (BMI) 30.0-30.9, adult: Secondary | ICD-10-CM

## 2020-08-14 DIAGNOSIS — F419 Anxiety disorder, unspecified: Secondary | ICD-10-CM

## 2020-08-14 NOTE — Progress Notes (Signed)
I,Katawbba Wiggins,acting as a Neurosurgeon for Gwynneth Aliment, MD.,have documented all relevant documentation on the behalf of Gwynneth Aliment, MD,as directed by  Gwynneth Aliment, MD while in the presence of Gwynneth Aliment, MD.  This visit occurred during the SARS-CoV-2 public health emergency.  Safety protocols were in place, including screening questions prior to the visit, additional usage of staff PPE, and extensive cleaning of exam room while observing appropriate contact time as indicated for disinfecting solutions.  Subjective:     Patient ID: Ruth Davis , female    DOB: 1956-09-17 , 64 y.o.   MRN: [id number]   Chief Complaint  Patient presents with  . Hyperlipidemia  . Hypertension    HPI  She presents today for f/u hyperlipidemia and hypertension. She did not start rx meds for her cholesterol. She prefers to work on diet/exercise.   Hyperlipidemia This is a chronic problem. The current episode started more than 1 year ago. The problem is uncontrolled. Recent lipid tests were reviewed and are high. Pertinent negatives include no focal weakness, myalgias or shortness of breath. She is currently on no antihyperlipidemic treatment.     Past Medical History:  Diagnosis Date  . Allergic rhinitis   . Essential hypertension, benign      Family History  Problem Relation Age of Onset  . Breast cancer Sister   . Breast cancer Maternal Grandmother   . Alzheimer's disease Mother   . Bone cancer Father   . Throat cancer Brother   . Breast cancer Sister   . Colon cancer Sister      Current Outpatient Medications:  .  albuterol (VENTOLIN HFA) 108 (90 Base) MCG/ACT inhaler, Inhale into the lungs every 6 (six) hours as needed for wheezing or shortness of breath., Disp: , Rfl:  .  amLODipine (NORVASC) 2.5 MG tablet, Take 1 tablet (2.5 mg total) by mouth daily. Take with evening meal, Disp: 90 tablet, Rfl: 2   Allergies  Allergen Reactions  . Acetaminophen Other (See  Comments)    Hives/swelling  . Fomepizole Other (See Comments)    Hives/swelling     Review of Systems  Constitutional: Negative.   Respiratory: Negative.  Negative for shortness of breath.   Cardiovascular: Negative.   Gastrointestinal: Negative.   Musculoskeletal: Negative for myalgias.  Neurological: Negative for focal weakness.  Psychiatric/Behavioral: The patient is nervous/anxious.        She reports that she has been feeling more anxious. Not sure what is triggering her sx. She does admit work has become more stressful. Denies cp, SOB and palpitations.   All other systems reviewed and are negative.    Today's Vitals   08/14/20 1150  BP: 114/72  Pulse: 60  Temp: 98.2 F (36.8 C)  TempSrc: Oral  Weight: 180 lb 12.8 oz (82 kg)  Height: 5' 4.2" (1.631 m)   Body mass index is 30.84 kg/m.  Wt Readings from Last 3 Encounters:  08/14/20 180 lb 12.8 oz (82 kg)  07/05/20 180 lb 12.8 oz (82 kg)  04/04/20 180 lb 5 oz (81.8 kg)   Objective:  Physical Exam Vitals and nursing note reviewed.  Constitutional:      Appearance: Normal appearance. She is obese.  HENT:     Head: Normocephalic and atraumatic.  Cardiovascular:     Rate and Rhythm: Normal rate and regular rhythm.     Heart sounds: Normal heart sounds.  Pulmonary:     Breath sounds: Normal breath sounds.  Skin:  General: Skin is warm.  Neurological:     General: No focal deficit present.     Mental Status: She is alert and oriented to person, place, and time.         Assessment And Plan:     1. Pure hypercholesterolemia Comments: She is willing to consider natural supplements to address this. Also encouraged to avoid fried foods and increase exercise. That being said, I do think she will require rx meds in order to reach optimal LDL.   2. Essential hypertension, benign Comments: Well controlled. She will continue with current meds.   3. Anxiety Comments: Advised to start with magnesium supplementation  nightly, 250-400mg . We will discuss further at her next visit should her sx persist. She is also encouraged to incorporate stress-relieving techniques into her daily routine. She is encouraged to contact me sooner if she feels her sx are worsening.   4. Class 1 obesity due to excess calories without serious comorbidity with body mass index (BMI) of 30.0 to 30.9 in adult She is encouraged to aim for at least 150 minutes of exercise per week.     Patient was given opportunity to ask questions. Patient verbalized understanding of the plan and was able to repeat key elements of the plan. All questions were answered to their satisfaction.  Gwynneth Aliment, MD   I, Gwynneth Aliment, MD, have reviewed all documentation for this visit. The documentation on 09/10/20 for the exam, diagnosis, procedures, and orders are all accurate and complete.  THE PATIENT IS ENCOURAGED TO PRACTICE SOCIAL DISTANCING DUE TO THE COVID-19 PANDEMIC.

## 2020-08-14 NOTE — Patient Instructions (Signed)

## 2020-09-02 ENCOUNTER — Encounter: Payer: Self-pay | Admitting: Internal Medicine

## 2020-09-26 ENCOUNTER — Other Ambulatory Visit: Payer: Self-pay

## 2020-09-26 ENCOUNTER — Ambulatory Visit
Admission: RE | Admit: 2020-09-26 | Discharge: 2020-09-26 | Disposition: A | Discharge status: Home / Self Care | Payer: BC Managed Care – PPO | Source: Ambulatory Visit | Attending: Internal Medicine | Admitting: Internal Medicine

## 2020-09-26 DIAGNOSIS — Z0000 Encounter for general adult medical examination without abnormal findings: Secondary | ICD-10-CM

## 2020-09-27 NOTE — Progress Notes (Signed)
° °  Covid-19 Vaccination Clinic  Name:  Ruth Davis    MRN: [id number] DOB: 1955/10/24  09/27/2020  [name] was observed post Covid-19 immunization for 15 minutes without incident. She was provided with Vaccine Information Sheet and instruction to access the V-Safe system.   [name] was instructed to call 911 with any severe reactions post vaccine:  Difficulty breathing   Swelling of face and throat   A fast heartbeat   A bad rash all over body   Dizziness and weakness   Immunizations Administered    Name Date Dose VIS Date Route   Pfizer COVID-19 Vaccine 07/31/2020  8:00 AM 0.3 mL 07/19/2020 Intramuscular   Manufacturer: [employer], [employer]   Lot: [id number]   NDC: [address]-4

## 2020-10-02 ENCOUNTER — Ambulatory Visit: Payer: BC Managed Care – PPO | Admitting: Internal Medicine

## 2020-10-02 ENCOUNTER — Other Ambulatory Visit: Payer: Self-pay

## 2020-10-02 ENCOUNTER — Encounter: Payer: Self-pay | Admitting: Internal Medicine

## 2020-10-02 VITALS — BP 140/93 | HR 63 | Temp 97.8°F | Ht 64.2 in | Wt 184.4 lb

## 2020-10-02 DIAGNOSIS — F5102 Adjustment insomnia: Secondary | ICD-10-CM | POA: Diagnosis not present

## 2020-10-02 DIAGNOSIS — I10 Essential (primary) hypertension: Secondary | ICD-10-CM

## 2020-10-02 DIAGNOSIS — Z6831 Body mass index (BMI) 31.0-31.9, adult: Secondary | ICD-10-CM

## 2020-10-02 DIAGNOSIS — L989 Disorder of the skin and subcutaneous tissue, unspecified: Secondary | ICD-10-CM

## 2020-10-02 DIAGNOSIS — E66811 Obesity, class 1: Secondary | ICD-10-CM

## 2020-10-02 DIAGNOSIS — F419 Anxiety disorder, unspecified: Secondary | ICD-10-CM | POA: Diagnosis not present

## 2020-10-02 DIAGNOSIS — E7800 Pure hypercholesterolemia, unspecified: Secondary | ICD-10-CM | POA: Diagnosis not present

## 2020-10-02 DIAGNOSIS — E6609 Other obesity due to excess calories: Secondary | ICD-10-CM

## 2020-10-02 NOTE — Progress Notes (Signed)
I,Tianna Badgett,acting as a Neurosurgeon for Gwynneth Aliment, MD.,have documented all relevant documentation on the behalf of Gwynneth Aliment, MD,as directed by  Gwynneth Aliment, MD while in the presence of Gwynneth Aliment, MD.  This visit occurred during the SARS-CoV-2 public health emergency.  Safety protocols were in place, including screening questions prior to the visit, additional usage of staff PPE, and extensive cleaning of exam room while observing appropriate contact time as indicated for disinfecting solutions.  Subjective:     Patient ID: Ruth Davis , female    DOB: [birth year] , 65 y.o.   MRN: [id number]   Chief Complaint  Patient presents with  . Hyperlipidemia  . Hypertension    HPI  She presents today for f/u hyperlipidemia and hypertension. She did not start rx meds for her cholesterol. She prefers to work on diet/exercise.  BP Readings from Last 3 Encounters: 10/02/20 : (!) 140/93 08/14/20 : 114/72 07/05/20 : 126/78   Hyperlipidemia This is a chronic problem. The current episode started more than 1 year ago. The problem is uncontrolled. Recent lipid tests were reviewed and are high. Pertinent negatives include no focal weakness, myalgias or shortness of breath. She is currently on no antihyperlipidemic treatment.     Past Medical History:  Diagnosis Date  . Allergic rhinitis   . Essential hypertension, benign      Family History  Problem Relation Age of Onset  . Breast cancer Sister   . Breast cancer Maternal Grandmother   . Alzheimer's disease Mother   . Bone cancer Father   . Throat cancer Brother   . Breast cancer Sister   . Colon cancer Sister      Current Outpatient Medications:  .  albuterol (VENTOLIN HFA) 108 (90 Base) MCG/ACT inhaler, Inhale into the lungs every 6 (six) hours as needed for wheezing or shortness of breath., Disp: , Rfl:  .  amLODipine (NORVASC) 2.5 MG tablet, Take 1 tablet (2.5 mg total) by mouth daily. Take with evening  meal, Disp: 90 tablet, Rfl: 2   Allergies  Allergen Reactions  . Acetaminophen Other (See Comments)    Hives/swelling  . Fomepizole Other (See Comments)    Hives/swelling     Review of Systems  Constitutional: Negative.   Respiratory: Negative.  Negative for shortness of breath.   Cardiovascular: Negative.   Gastrointestinal: Negative.   Musculoskeletal: Negative for myalgias.  Skin: Positive for color change.       Right heel  Neurological: Negative.  Negative for focal weakness.  Psychiatric/Behavioral: Positive for sleep disturbance. The patient is nervous/anxious.        She states her anxiety did not improve over the holidays as she had hoped. She reports this is affecting her ability to focus while at work. She has had difficulty sleeping, even while on holiday vacation. She is able to fall asleep, but has difficulty staying asleep.      Today's Vitals   10/02/20 1155  BP: (!) 140/93  Pulse: 63  Temp: 97.8 F (36.6 C)  TempSrc: Oral  Weight: 184 lb 6.4 oz (83.6 kg)  Height: 5' 4.2" (1.631 m)   Body mass index is 31.46 kg/m.  Wt Readings from Last 3 Encounters:  10/02/20 184 lb 6.4 oz (83.6 kg)  08/14/20 180 lb 12.8 oz (82 kg)  07/05/20 180 lb 12.8 oz (82 kg)     Objective:  Physical Exam Vitals and nursing note reviewed.  Constitutional:      Appearance: Normal  appearance.  HENT:     Head: Normocephalic and atraumatic.  Cardiovascular:     Rate and Rhythm: Normal rate and regular rhythm.     Heart sounds: Normal heart sounds.  Pulmonary:     Effort: Pulmonary effort is normal.     Breath sounds: Normal breath sounds.  Skin:    General: Skin is warm.     Comments: Flesh colored lesion on right achilles. There is no surrounding erythema. Dry skin protruding from large pore. Area is non-tender to palpation  Neurological:     General: No focal deficit present.     Mental Status: She is alert.  Psychiatric:        Mood and Affect: Mood normal.         Behavior: Behavior normal.         Assessment And Plan:     1. Essential hypertension, benign Comments: Chronic, fair control. She will continue with current meds for now. Encouraged to follow low sodium diet.   2. Pure hypercholesterolemia Comments: Chronic, she agrees to advanced lipid testing. I will send labs off to [facility]. I will make further recommendations once her labs are avail for review.  3. Anxiety Comments: Persistent. Wants to defer rx meds for now. We discussed use of magnesium supplementation. She may also benefit from L-theanine. 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 She will think about this. She does agree to specialist evaluation as well.  - Ambulatory referral to Psychiatry  4. Adjustment insomnia Comments: Again, she will first try magnesium nightly. I suggested Calm brand, 1 tsp nightly. She will let me know if her sx persist. She is reminded of importance of bedtime routine as well.  - Ambulatory referral to Psychiatry  5. Skin lesion Comments: This appeared to be comedone. After obtaining verbal consent, area was prepped and cleaned w/ Betadine. I did attempt to remove skin lesion, and I was only able to remove part of the lesion. She agrees to [facility] referral for removal and confirmation of diagnosis.  - Ambulatory referral to Dermatology  6. Class 1 obesity due to excess calories with serious comorbidity and body mass index (BMI) of 31.0 to 31.9 in adult  She is encouraged to strive for BMI less than 30 to decrease cardiac risk. Advised to aim for at least 150 minutes of exercise per week.  Patient was given opportunity to ask questions. Patient verbalized understanding of the plan and was able to repeat key elements of the plan. All questions were answered to their satisfaction.  Gwynneth Aliment, MD   I, Gwynneth Aliment, MD, have reviewed all documentation for this visit. The  documentation on 10/02/20 for the exam, diagnosis, procedures, and orders are all accurate and complete.  THE PATIENT IS ENCOURAGED TO PRACTICE SOCIAL DISTANCING DUE TO THE COVID-19 PANDEMIC.

## 2020-10-02 NOTE — Patient Instructions (Addendum)
Calm, magnesium one tsp nightly  Hypertension, Adult High blood pressure (hypertension) is when the force of blood pumping through the arteries is too strong. The arteries are the blood vessels that carry blood from the heart throughout the body. Hypertension forces the heart to work harder to pump blood and may cause arteries to become narrow or stiff. Untreated or uncontrolled hypertension can cause a heart attack, heart failure, a stroke, kidney disease, and other problems. A blood pressure reading consists of a higher number over a lower number. Ideally, your blood pressure should be below 120/80. The first ("top") number is called the systolic pressure. It is a measure of the pressure in your arteries as your heart beats. The second ("bottom") number is called the diastolic pressure. It is a measure of the pressure in your arteries as the heart relaxes. What are the causes? The exact cause of this condition is not known. There are some conditions that result in or are related to high blood pressure. What increases the risk? Some risk factors for high blood pressure are under your control. The following factors may make you more likely to develop this condition:  Smoking.  Having type 2 diabetes mellitus, high cholesterol, or both.  Not getting enough exercise or physical activity.  Being overweight.  Having too much fat, sugar, calories, or salt (sodium) in your diet.  Drinking too much alcohol. Some risk factors for high blood pressure may be difficult or impossible to change. Some of these factors include:  Having chronic kidney disease.  Having a family history of high blood pressure.  Age. Risk increases with age.  Race. You may be at higher risk if you are African American.  Gender. Men are at higher risk than women before age 71. After age 16, women are at higher risk than men.  Having obstructive sleep apnea.  Stress. What are the signs or symptoms? High blood  pressure may not cause symptoms. Very high blood pressure (hypertensive crisis) may cause:  Headache.  Anxiety.  Shortness of breath.  Nosebleed.  Nausea and vomiting.  Vision changes.  Severe chest pain.  Seizures. How is this diagnosed? This condition is diagnosed by measuring your blood pressure while you are seated, with your arm resting on a flat surface, your legs uncrossed, and your feet flat on the floor. The cuff of the blood pressure monitor will be placed directly against the skin of your upper arm at the level of your heart. It should be measured at least twice using the same arm. Certain conditions can cause a difference in blood pressure between your right and left arms. Certain factors can cause blood pressure readings to be lower or higher than normal for a short period of time:  When your blood pressure is higher when you are in a health care provider's office than when you are at home, this is called white coat hypertension. Most people with this condition do not need medicines.  When your blood pressure is higher at home than when you are in a health care provider's office, this is called masked hypertension. Most people with this condition may need medicines to control blood pressure. If you have a high blood pressure reading during one visit or you have normal blood pressure with other risk factors, you may be asked to:  Return on a different day to have your blood pressure checked again.  Monitor your blood pressure at home for 1 week or longer. If you are diagnosed with hypertension,  you may have other blood or imaging tests to help your health care provider understand your overall risk for other conditions. How is this treated? This condition is treated by making healthy lifestyle changes, such as eating healthy foods, exercising more, and reducing your alcohol intake. Your health care provider may prescribe medicine if lifestyle changes are not enough to get  your blood pressure under control, and if:  Your systolic blood pressure is above 130.  Your diastolic blood pressure is above 80. Your personal target blood pressure may vary depending on your medical conditions, your age, and other factors. Follow these instructions at home: Eating and drinking   Eat a diet that is high in fiber and potassium, and low in sodium, added sugar, and fat. An example eating plan is called the DASH (Dietary Approaches to Stop Hypertension) diet. To eat this way: ? Eat plenty of fresh fruits and vegetables. Try to fill one half of your plate at each meal with fruits and vegetables. ? Eat whole grains, such as whole-wheat pasta, brown rice, or whole-grain bread. Fill about one fourth of your plate with whole grains. ? Eat or drink low-fat dairy products, such as skim milk or low-fat yogurt. ? Avoid fatty cuts of meat, processed or cured meats, and poultry with skin. Fill about one fourth of your plate with lean proteins, such as fish, chicken without skin, beans, eggs, or tofu. ? Avoid pre-made and processed foods. These tend to be higher in sodium, added sugar, and fat.  Reduce your daily sodium intake. Most people with hypertension should eat less than 1,500 mg of sodium a day.  Do not drink alcohol if: ? Your health care provider tells you not to drink. ? You are pregnant, may be pregnant, or are planning to become pregnant.  If you drink alcohol: ? Limit how much you use to:  0-1 drink a day for women.  0-2 drinks a day for men. ? Be aware of how much alcohol is in your drink. In the [place] one drink equals one 12 oz bottle of beer (355 mL), one 5 oz glass of wine (148 mL), or one 1 oz glass of hard liquor (44 mL). Lifestyle   Work with your health care provider to maintain a healthy body weight or to lose weight. Ask what an ideal weight is for you.  Get at least 30 minutes of exercise most days of the week. Activities may include walking, swimming,  or biking.  Include exercise to strengthen your muscles (resistance exercise), such as Pilates or lifting weights, as part of your weekly exercise routine. Try to do these types of exercises for 30 minutes at least 3 days a week.  Do not use any products that contain nicotine or tobacco, such as cigarettes, e-cigarettes, and chewing tobacco. If you need help quitting, ask your health care provider.  Monitor your blood pressure at home as told by your health care provider.  Keep all follow-up visits as told by your health care provider. This is important. Medicines  Take over-the-counter and prescription medicines only as told by your health care provider. Follow directions carefully. Blood pressure medicines must be taken as prescribed.  Do not skip doses of blood pressure medicine. Doing this puts you at risk for problems and can make the medicine less effective.  Ask your health care provider about side effects or reactions to medicines that you should watch for. Contact a health care provider if you:  Think you are having  a reaction to a medicine you are taking.  Have headaches that keep coming back (recurring).  Feel dizzy.  Have swelling in your ankles.  Have trouble with your vision. Get help right away if you:  Develop a severe headache or confusion.  Have unusual weakness or numbness.  Feel faint.  Have severe pain in your chest or abdomen.  Vomit repeatedly.  Have trouble breathing. Summary  Hypertension is when the force of blood pumping through your arteries is too strong. If this condition is not controlled, it may put you at risk for serious complications.  Your personal target blood pressure may vary depending on your medical conditions, your age, and other factors. For most people, a normal blood pressure is less than 120/80.  Hypertension is treated with lifestyle changes, medicines, or a combination of both. Lifestyle changes include losing weight,  eating a healthy, low-sodium diet, exercising more, and limiting alcohol. This information is not intended to replace advice given to you by your health care provider. Make sure you discuss any questions you have with your health care provider. Document Revised: 05/27/2018 Document Reviewed: 05/27/2018 Elsevier Patient Education  2020 [employer].

## 2020-10-09 ENCOUNTER — Encounter: Payer: Self-pay | Admitting: Internal Medicine

## 2020-11-17 ENCOUNTER — Encounter: Payer: Self-pay | Admitting: Internal Medicine

## 2020-12-08 ENCOUNTER — Encounter: Payer: Self-pay | Admitting: Internal Medicine

## 2021-01-04 ENCOUNTER — Other Ambulatory Visit: Payer: Self-pay

## 2021-01-04 ENCOUNTER — Encounter: Payer: Self-pay | Admitting: Internal Medicine

## 2021-01-04 ENCOUNTER — Ambulatory Visit: Payer: BC Managed Care – PPO | Admitting: Internal Medicine

## 2021-01-04 VITALS — BP 124/82 | HR 63 | Temp 98.1°F | Ht 64.0 in | Wt 184.8 lb

## 2021-01-04 DIAGNOSIS — E7800 Pure hypercholesterolemia, unspecified: Secondary | ICD-10-CM

## 2021-01-04 DIAGNOSIS — I10 Essential (primary) hypertension: Secondary | ICD-10-CM

## 2021-01-04 DIAGNOSIS — Z6831 Body mass index (BMI) 31.0-31.9, adult: Secondary | ICD-10-CM | POA: Diagnosis not present

## 2021-01-04 DIAGNOSIS — E6609 Other obesity due to excess calories: Secondary | ICD-10-CM | POA: Diagnosis not present

## 2021-01-04 MED ORDER — ATORVASTATIN CALCIUM 80 MG PO TABS: 80.0000 mg | ORAL_TABLET | Freq: Every day | ORAL | 1 refills | Status: DC

## 2021-01-04 NOTE — Patient Instructions (Signed)

## 2021-01-04 NOTE — Progress Notes (Signed)
This visit occurred during the SARS-CoV-2 public health emergency.  Safety protocols were in place, including screening questions prior to the visit, additional usage of staff PPE, and extensive cleaning of exam room while observing appropriate contact time as indicated for disinfecting solutions.  Subjective:     Patient ID: Ruth Davis , female    DOB: Jan 18, 1956 , 65 y.o.   MRN: [id number]   Chief Complaint  Patient presents with  . Hypertension    HPI  She presents today for f/u hypertension. She reports compliance with meds. She has been walking regularly as well. She would like to go over her [facility] results as well.   Hyperlipidemia This is a chronic problem. The current episode started more than 1 year ago. The problem is uncontrolled. Recent lipid tests were reviewed and are high. Pertinent negatives include no focal weakness, myalgias or shortness of breath. She is currently on no antihyperlipidemic treatment.     Past Medical History:  Diagnosis Date  . Allergic rhinitis   . Essential hypertension, benign      Family History  Problem Relation Age of Onset  . Breast cancer Sister   . Breast cancer Maternal Grandmother   . Alzheimer's disease Mother   . Bone cancer Father   . Throat cancer Brother   . Breast cancer Sister   . Colon cancer Sister      Current Outpatient Medications:  .  albuterol (VENTOLIN HFA) 108 (90 Base) MCG/ACT inhaler, Inhale into the lungs every 6 (six) hours as needed for wheezing or shortness of breath., Disp: , Rfl:  .  amLODipine (NORVASC) 2.5 MG tablet, Take 1 tablet (2.5 mg total) by mouth daily. Take with evening meal, Disp: 90 tablet, Rfl: 2 .  atorvastatin (LIPITOR) 80 MG tablet, Take 1 tablet (80 mg total) by mouth daily., Disp: 90 tablet, Rfl: 1   Allergies  Allergen Reactions  . Acetaminophen Other (See Comments)    Hives/swelling  . Fomepizole Other (See Comments)    Hives/swelling     Review of Systems   Constitutional: Negative.   HENT: Negative.   Eyes: Negative.   Respiratory: Negative.  Negative for shortness of breath.   Cardiovascular: Negative.   Gastrointestinal: Negative.   Endocrine: Negative.   Genitourinary: Negative.   Musculoskeletal: Negative.  Negative for myalgias.  Skin: Negative.   Allergic/Immunologic: Negative.   Neurological: Negative.  Negative for focal weakness.  Hematological: Negative.   Psychiatric/Behavioral: Negative.      Today's Vitals   01/04/21 0837  BP: 124/82  Pulse: 63  Temp: 98.1 F (36.7 C)  Weight: 184 lb 12.8 oz (83.8 kg)  Height: 5\' 4"  (1.626 m)  PainSc: 0-No pain   Body mass index is 31.72 kg/m.  Wt Readings from Last 3 Encounters:  01/04/21 184 lb 12.8 oz (83.8 kg)  10/02/20 184 lb 6.4 oz (83.6 kg)  08/14/20 180 lb 12.8 oz (82 kg)    Objective:  Physical Exam      Assessment And Plan:     1. Essential hypertension, benign Comments: Chronic, well controlled. She is encouraged to follow low sodium diet. She will f/u in four to six months for re-evaluation.   2. Pure hypercholesterolemia Comments: I went over her results for greater than 25 minutes. Full [place] results were be scanned into her chart. She agrees to start statin therapy, I will send rx for atorvastatin, 80mg  daily. Due to LDL level, she is advised that her condition is likely familial in  nature. She agrees to rto in six weeks for cholesterol check. Advised to take fiber supplement to address cholesterol absorption and to continue living a heart healthy diet.   3. Class 1 obesity due to excess calories with serious comorbidity and body mass index (BMI) of 31.0 to 31.9 in adult Comments: She is encouraged to strive for BMI less than 30 to decrease cardiac risk. Advised to aim for at least 150 minutes of exercise per week.    I personally spent greater than 25 minutes face-to-face and non-face-to-face in the care of this patient, which includes all pre-, intra-,  and post visit time on the date of service.  Patient was given opportunity to ask questions. Patient verbalized understanding of the plan and was able to repeat key elements of the plan. All questions were answered to their satisfaction.   I, Gwynneth Aliment, MD, have reviewed all documentation for this visit. The documentation on 01/21/21 for the exam, diagnosis, procedures, and orders are all accurate and complete.   IF YOU HAVE BEEN REFERRED TO A SPECIALIST, IT MAY TAKE 1-2 WEEKS TO SCHEDULE/PROCESS THE REFERRAL. IF YOU HAVE NOT HEARD FROM US/SPECIALIST IN TWO WEEKS, PLEASE GIVE [place] A CALL AT [phone] X [phone].   THE PATIENT IS ENCOURAGED TO PRACTICE SOCIAL DISTANCING DUE TO THE COVID-19 PANDEMIC.

## 2021-02-08 ENCOUNTER — Telehealth: Payer: Self-pay

## 2021-02-08 ENCOUNTER — Encounter: Payer: Self-pay | Admitting: Internal Medicine

## 2021-02-08 NOTE — Telephone Encounter (Signed)
The pt was contacted and her 05/19 appt was rescheduled for 05/23.

## 2021-02-15 ENCOUNTER — Ambulatory Visit: Payer: BC Managed Care – PPO | Admitting: Internal Medicine

## 2021-02-19 ENCOUNTER — Other Ambulatory Visit: Payer: Self-pay

## 2021-02-19 ENCOUNTER — Encounter: Payer: Self-pay | Admitting: Internal Medicine

## 2021-02-19 ENCOUNTER — Ambulatory Visit: Payer: BC Managed Care – PPO | Admitting: Internal Medicine

## 2021-02-19 VITALS — BP 116/84 | HR 63 | Temp 98.2°F | Ht 64.0 in | Wt 178.2 lb

## 2021-02-19 DIAGNOSIS — I10 Essential (primary) hypertension: Secondary | ICD-10-CM

## 2021-02-19 DIAGNOSIS — Z6830 Body mass index (BMI) 30.0-30.9, adult: Secondary | ICD-10-CM

## 2021-02-19 DIAGNOSIS — E7800 Pure hypercholesterolemia, unspecified: Secondary | ICD-10-CM | POA: Diagnosis not present

## 2021-02-19 DIAGNOSIS — E6609 Other obesity due to excess calories: Secondary | ICD-10-CM

## 2021-02-19 NOTE — Patient Instructions (Signed)
High Cholesterol  High cholesterol is a condition in which the blood has high levels of a white, waxy substance similar to fat (cholesterol). The liver makes all the cholesterol that the body needs. The human body needs small amounts of cholesterol to help build cells. A person gets extra or excess cholesterol from the food that he or she eats. The blood carries cholesterol from the liver to the rest of the body. If you have high cholesterol, deposits (plaques) may build up on the walls of your arteries. Arteries are the blood vessels that carry blood away from your heart. These plaques make the arteries narrow and stiff. Cholesterol plaques increase your risk for heart attack and stroke. Work with your health care provider to keep your cholesterol levels in a healthy range. What increases the risk? The following factors may make you more likely to develop this condition:  Eating foods that are high in animal fat (saturated fat) or cholesterol.  Being overweight.  Not getting enough exercise.  A family history of high cholesterol (familial hypercholesterolemia).  Use of tobacco products.  Having diabetes. What are the signs or symptoms? There are no symptoms of this condition. How is this diagnosed? This condition may be diagnosed based on the results of a blood test.  If you are older than 65 years of age, your health care provider may check your cholesterol levels every 4-6 years.  You may be checked more often if you have high cholesterol or other risk factors for heart disease. The blood test for cholesterol measures:  "Bad" cholesterol, or LDL cholesterol. This is the main type of cholesterol that causes heart disease. The desired level is less than 100 mg/dL.  "Good" cholesterol, or HDL cholesterol. HDL helps protect against heart disease by cleaning the arteries and carrying the LDL to the liver for processing. The desired level for HDL is 60 mg/dL or higher.  Triglycerides.  These are fats that your body can store or burn for energy. The desired level is less than 150 mg/dL.  Total cholesterol. This measures the total amount of cholesterol in your blood and includes LDL, HDL, and triglycerides. The desired level is less than 200 mg/dL. How is this treated? This condition may be treated with:  Diet changes. You may be asked to eat foods that have more fiber and less saturated fats or added sugar.  Lifestyle changes. These may include regular exercise, maintaining a healthy weight, and quitting use of tobacco products.  Medicines. These are given when diet and lifestyle changes have not worked. You may be prescribed a statin medicine to help lower your cholesterol levels. Follow these instructions at home: Eating and drinking  Eat a healthy, balanced diet. This diet includes: ? Daily servings of a variety of fresh, frozen, or canned fruits and vegetables. ? Daily servings of whole grain foods that are rich in fiber. ? Foods that are low in saturated fats and trans fats. These include poultry and fish without skin, lean cuts of meat, and low-fat dairy products. ? A variety of fish, especially oily fish that contain omega-3 fatty acids. Aim to eat fish at least 2 times a week.  Avoid foods and drinks that have added sugar.  Use healthy cooking methods, such as roasting, grilling, broiling, baking, poaching, steaming, and stir-frying. Do not fry your food except for stir-frying.   Lifestyle  Get regular exercise. Aim to exercise for a total of 150 minutes a week. Increase your activity level by doing activities   such as gardening, walking, and taking the stairs.  Do not use any products that contain nicotine or tobacco, such as cigarettes, e-cigarettes, and chewing tobacco. If you need help quitting, ask your health care provider.   General instructions  Take over-the-counter and prescription medicines only as told by your health care provider.  Keep all  follow-up visits as told by your health care provider. This is important. Where to find more information  American Heart Association: www.heart.org  National Heart, Lung, and Blood Institute: www.nhlbi.nih.gov Contact a health care provider if:  You have trouble achieving or maintaining a healthy diet or weight.  You are starting an exercise program.  You are unable to stop smoking. Get help right away if:  You have chest pain.  You have trouble breathing.  You have any symptoms of a stroke. "BE FAST" is an easy way to remember the main warning signs of a stroke: ? B - Balance. Signs are dizziness, sudden trouble walking, or loss of balance. ? E - Eyes. Signs are trouble seeing or a sudden change in vision. ? F - Face. Signs are sudden weakness or numbness of the face, or the face or eyelid drooping on one side. ? A - Arms. Signs are weakness or numbness in an arm. This happens suddenly and usually on one side of the body. ? S - Speech. Signs are sudden trouble speaking, slurred speech, or trouble understanding what people say. ? T - Time. Time to call emergency services. Write down what time symptoms started.  You have other signs of a stroke, such as: ? A sudden, severe headache with no known cause. ? Nausea or vomiting. ? Seizure. These symptoms may represent a serious problem that is an emergency. Do not wait to see if the symptoms will go away. Get medical help right away. Call your local emergency services (911 in the [place] Do not drive yourself to the hospital. Summary  Cholesterol plaques increase your risk for heart attack and stroke. Work with your health care provider to keep your cholesterol levels in a healthy range.  Eat a healthy, balanced diet, get regular exercise, and maintain a healthy weight.  Do not use any products that contain nicotine or tobacco, such as cigarettes, e-cigarettes, and chewing tobacco.  Get help right away if you have any symptoms of a  stroke. This information is not intended to replace advice given to you by your health care provider. Make sure you discuss any questions you have with your health care provider. Document Revised: 08/16/2019 Document Reviewed: 08/16/2019 Elsevier Patient Education  2021 Elsevier Inc.  

## 2021-02-19 NOTE — Progress Notes (Signed)
I,Katawbba Wiggins,acting as a Education administrator for [name], MD.,have documented all relevant documentation on the behalf of [name], MD,as directed by  [name], MD while in the presence of [name], MD.  This visit occurred during the SARS-CoV-2 public health emergency.  Safety protocols were in place, including screening questions prior to the visit, additional usage of staff PPE, and extensive cleaning of exam room while observing appropriate contact time as indicated for disinfecting solutions.  Subjective:     Patient ID: Ruth Davis , female    DOB: 12-14-1955 , 65 y.o.   MRN: [id number]   Chief Complaint  Patient presents with  . Hypertension    HPI  She presents today for f/u hypertension and cholesterol.  She reports compliance with meds. She denies headaches, chest pain and shortness of breath. She was started on atorvastatin at her last visit. She has not had any issues with the medication.   Hyperlipidemia This is a chronic problem. The current episode started more than 1 year ago. The problem is uncontrolled. Recent lipid tests were reviewed and are high. Pertinent negatives include no focal weakness, myalgias or shortness of breath. She is currently on no antihyperlipidemic treatment.     Past Medical History:  Diagnosis Date  . Allergic rhinitis   . Essential hypertension, benign   . High cholesterol      Family History  Problem Relation Age of Onset  . Breast cancer Sister   . Breast cancer Maternal Grandmother   . Alzheimer's disease Mother   . Bone cancer Father   . Throat cancer Brother   . Breast cancer Sister   . Colon cancer Sister      Current Outpatient Medications:  .  albuterol (VENTOLIN HFA) 108 (90 Base) MCG/ACT inhaler, Inhale into the lungs every 6 (six) hours as needed for wheezing or shortness of breath., Disp: , Rfl:  .  amLODipine (NORVASC) 2.5 MG tablet, Take 1 tablet (2.5 mg total) by mouth daily. Take with  evening meal, Disp: 90 tablet, Rfl: 2 .  atorvastatin (LIPITOR) 80 MG tablet, Take 1 tablet (80 mg total) by mouth daily. (Patient taking differently: Take 80 mg by mouth daily. Monday - Friday), Disp: 90 tablet, Rfl: 1   Allergies  Allergen Reactions  . Acetaminophen Other (See Comments)    Hives/swelling  . Fomepizole Other (See Comments)    Hives/swelling     Review of Systems  Constitutional: Negative.   Respiratory: Negative.  Negative for shortness of breath.   Cardiovascular: Negative.   Gastrointestinal: Negative.   Musculoskeletal: Negative for myalgias.  Neurological: Negative for focal weakness.  Psychiatric/Behavioral: Negative.   All other systems reviewed and are negative.    Today's Vitals   02/19/21 1004  BP: 116/84  Pulse: 63  Temp: 98.2 F (36.8 C)  TempSrc: Oral  Weight: 178 lb 3.2 oz (80.8 kg)  Height: '5\' 4"'  (1.626 m)   Body mass index is 30.59 kg/m.  Wt Readings from Last 3 Encounters:  02/19/21 178 lb 3.2 oz (80.8 kg)  01/04/21 184 lb 12.8 oz (83.8 kg)  10/02/20 184 lb 6.4 oz (83.6 kg)   BP Readings from Last 3 Encounters:  02/19/21 116/84  01/04/21 124/82  10/02/20 (!) 140/93   Objective:  Physical Exam Vitals and nursing note reviewed.  Constitutional:      Appearance: Normal appearance.  HENT:     Head: Normocephalic and atraumatic.     Nose:  Comments: Masked     Mouth/Throat:     Comments: Masked  Cardiovascular:     Rate and Rhythm: Normal rate and regular rhythm.     Heart sounds: Normal heart sounds.  Pulmonary:     Effort: Pulmonary effort is normal.     Breath sounds: Normal breath sounds.  Musculoskeletal:     Cervical back: Normal range of motion.  Skin:    General: Skin is warm.  Neurological:     General: No focal deficit present.     Mental Status: She is alert.  Psychiatric:        Mood and Affect: Mood normal.        Behavior: Behavior normal.         Assessment And Plan:     1. Essential  hypertension, benign Comments: Chronic, well controlled. She is encouraged to follow low sodium diet. She will RTO in October 2022 for her physical exam.  - CMP14+EGFR  2. Pure hypercholesterolemia Comments: She will continue with atorvastatin. I will check lipid panel/LFTs today.  - Lipid panel - CMP14+EGFR  3. Class 1 obesity due to excess calories with serious comorbidity and body mass index (BMI) of 30.0 to 30.9 in adult Comments: She is encouraged to continue with her regular exercise regimen.   Patient was given opportunity to ask questions. Patient verbalized understanding of the plan and was able to repeat key elements of the plan. All questions were answered to their satisfaction.   I, [name], MD, have reviewed all documentation for this visit. The documentation on 02/19/21 for the exam, diagnosis, procedures, and orders are all accurate and complete.   IF YOU HAVE BEEN REFERRED TO A SPECIALIST, IT MAY TAKE 1-2 WEEKS TO SCHEDULE/PROCESS THE REFERRAL. IF YOU HAVE NOT HEARD FROM US/SPECIALIST IN TWO WEEKS, PLEASE GIVE [place] A CALL AT [phone].   THE PATIENT IS ENCOURAGED TO PRACTICE SOCIAL DISTANCING DUE TO THE COVID-19 PANDEMIC.

## 2021-02-20 ENCOUNTER — Encounter: Payer: Self-pay | Admitting: Internal Medicine

## 2021-02-20 LAB — LIPID PANEL
Chol/HDL Ratio: 2.6 ratio (ref 0.0–4.4)
Cholesterol, Total: 157 mg/dL (ref 100–199)
HDL: 61 mg/dL (ref >39)
LDL Chol Calc (NIH): 83 mg/dL (ref 0–99)
Triglycerides: 63 mg/dL (ref 0–149)
VLDL Cholesterol Cal: 13 mg/dL (ref 5–40)

## 2021-02-20 LAB — CMP14+EGFR
ALT: 27 IU/L (ref 0–32)
AST: 31 IU/L (ref 0–40)
Albumin/Globulin Ratio: 1.4 (ref 1.2–2.2)
Albumin: 4.2 g/dL (ref 3.8–4.8)
Alkaline Phosphatase: 66 IU/L (ref 44–121)
BUN/Creatinine Ratio: 11 — ABNORMAL LOW (ref 12–28)
BUN: 9 mg/dL (ref 8–27)
Bilirubin Total: 0.6 mg/dL (ref 0.0–1.2)
CO2: 27 mmol/L (ref 20–29)
Calcium: 9.3 mg/dL (ref 8.7–10.3)
Chloride: 106 mmol/L (ref 96–106)
Creatinine, Ser: 0.81 mg/dL (ref 0.57–1.00)
Globulin, Total: 3 g/dL (ref 1.5–4.5)
Glucose: 84 mg/dL (ref 65–99)
Potassium: 4.1 mmol/L (ref 3.5–5.2)
Sodium: 146 mmol/L — ABNORMAL HIGH (ref 134–144)
Total Protein: 7.2 g/dL (ref 6.0–8.5)
eGFR: 81 mL/min/{1.73_m2} (ref >59)

## 2021-03-02 ENCOUNTER — Ambulatory Visit: Payer: Self-pay | Attending: Family

## 2021-03-02 DIAGNOSIS — Z23 Encounter for immunization: Secondary | ICD-10-CM

## 2021-03-02 NOTE — Progress Notes (Signed)
   Covid-19 Vaccination Clinic  Name:  Alessa Mazur    MRN: [id number] DOB: 1955/10/20  03/02/2021  [name] was observed post Covid-19 immunization for 15 minutes without incident. She was provided with Vaccine Information Sheet and instruction to access the V-Safe system.   Ms. Bojarski was instructed to call 911 with any severe reactions post vaccine: Marland Kitchen Difficulty breathing  . Swelling of face and throat  . A fast heartbeat  . A bad rash all over body  . Dizziness and weakness   Immunizations Administered    Name Date Dose VIS Date Route   Pfizer COVID-19 Vaccine 03/02/2021  3:15 PM 0.3 mL 07/19/2020 Intramuscular   Manufacturer: [employer], [employer]   Lot: [id number]   NDC: [address]-7

## 2021-03-31 ENCOUNTER — Other Ambulatory Visit: Payer: Self-pay | Admitting: Internal Medicine

## 2021-04-02 ENCOUNTER — Encounter: Payer: Self-pay | Admitting: Internal Medicine

## 2021-04-03 ENCOUNTER — Other Ambulatory Visit: Payer: Self-pay

## 2021-04-03 MED ORDER — AMLODIPINE BESYLATE 2.5 MG PO TABS: 2.5000 mg | ORAL_TABLET | Freq: Every day | ORAL | 2 refills | Status: DC

## 2021-04-05 ENCOUNTER — Ambulatory Visit: Payer: BC Managed Care – PPO | Admitting: Internal Medicine

## 2021-04-16 ENCOUNTER — Encounter: Payer: Self-pay | Admitting: Internal Medicine

## 2021-06-16 ENCOUNTER — Other Ambulatory Visit: Payer: Self-pay | Admitting: Internal Medicine

## 2021-07-16 ENCOUNTER — Encounter: Payer: BC Managed Care – PPO | Admitting: Internal Medicine

## 2021-08-13 ENCOUNTER — Other Ambulatory Visit: Payer: Self-pay

## 2021-08-13 ENCOUNTER — Encounter: Payer: Self-pay | Admitting: Internal Medicine

## 2021-08-13 ENCOUNTER — Ambulatory Visit (INDEPENDENT_AMBULATORY_CARE_PROVIDER_SITE_OTHER): Payer: Medicare Other | Admitting: Internal Medicine

## 2021-08-13 VITALS — BP 130/84 | HR 63 | Temp 98.5°F | Ht 63.6 in | Wt 176.2 lb

## 2021-08-13 DIAGNOSIS — Z23 Encounter for immunization: Secondary | ICD-10-CM | POA: Diagnosis not present

## 2021-08-13 DIAGNOSIS — I10 Essential (primary) hypertension: Secondary | ICD-10-CM

## 2021-08-13 DIAGNOSIS — E7800 Pure hypercholesterolemia, unspecified: Secondary | ICD-10-CM

## 2021-08-13 DIAGNOSIS — R7309 Other abnormal glucose: Secondary | ICD-10-CM

## 2021-08-13 DIAGNOSIS — E559 Vitamin D deficiency, unspecified: Secondary | ICD-10-CM

## 2021-08-13 DIAGNOSIS — E2839 Other primary ovarian failure: Secondary | ICD-10-CM

## 2021-08-13 DIAGNOSIS — Z0000 Encounter for general adult medical examination without abnormal findings: Secondary | ICD-10-CM

## 2021-08-13 DIAGNOSIS — E6609 Other obesity due to excess calories: Secondary | ICD-10-CM

## 2021-08-13 DIAGNOSIS — Z6830 Body mass index (BMI) 30.0-30.9, adult: Secondary | ICD-10-CM

## 2021-08-13 LAB — POCT URINALYSIS DIPSTICK
Bilirubin, UA: NEGATIVE
Blood, UA: NEGATIVE
Glucose, UA: NEGATIVE
Ketones, UA: NEGATIVE
Leukocytes, UA: NEGATIVE
Nitrite, UA: NEGATIVE
Protein, UA: NEGATIVE
Spec Grav, UA: >=1.03 — AB (ref 1.010–1.025)
Urobilinogen, UA: 0.2 E.U./dL
pH, UA: 5 (ref 5.0–8.0)

## 2021-08-13 LAB — POCT UA - MICROALBUMIN
Albumin/Creatinine Ratio, Urine, POC: 30
Creatinine, POC: 300 mg/dL
Microalbumin Ur, POC: 30 mg/L

## 2021-08-13 MED ORDER — PREVNAR 20 0.5 ML IM SUSY
0.5000 mL | PREFILLED_SYRINGE | INTRAMUSCULAR | 0 refills | Status: AC
Start: 2021-08-13 — End: 2021-08-13

## 2021-08-13 NOTE — Patient Instructions (Signed)
  Ms. Wirkkala , Thank you for taking time to come for your Medicare Wellness Visit. I appreciate your ongoing commitment to your health goals. Please review the following plan we discussed and let me know if I can assist you in the future.   These are the goals we discussed:  Goals      Exercise 150 min/wk Moderate Activity     She would like to continue with her current regimen.         This is a list of the screening recommended for you and due dates:  Health Maintenance  Topic Date Due   COVID-19 Vaccine (5 - Booster for Moderna series) 04/27/2021   Pneumonia Vaccine (1 - PCV) Never done   DEXA scan (bone density measurement)  05/09/2021   HIV Screening  08/13/2022*   Mammogram  09/26/2022   Pap Smear  07/06/2023   Colon Cancer Screening  07/31/2026   Tetanus Vaccine  09/27/2026   Flu Shot  Completed   Hepatitis C Screening: USPSTF Recommendation to screen - Ages 18-79 yo.  Completed   Zoster (Shingles) Vaccine  Completed   HPV Vaccine  Aged Out  *Topic was postponed. The date shown is not the original due date.

## 2021-08-13 NOTE — Progress Notes (Signed)
Subjective:    Ruth Davis is a 65 y.o. female who presents for a Welcome to Medicare exam. She reports compliance with meds. Denies headaches, chest pain and shortness of breath.   Review of Systems Review of Systems  Constitutional: Negative.   HENT: Negative.    Eyes: Negative.   Respiratory: Negative.    Cardiovascular: Negative.   Gastrointestinal: Negative.   Genitourinary: Negative.   Musculoskeletal: Negative.   Skin: Negative.   Neurological: Negative.   Endo/Heme/Allergies: Negative.   Psychiatric/Behavioral: Negative.    Cardiac Risk Factors include: advanced age (>29mn, >>61women);dyslipidemia;hypertension     Objective:    Today's Vitals   08/13/21 1149 08/13/21 1219  BP: 130/84   Pulse: 63   Temp: 98.5 F (36.9 C)   Weight: 176 lb 3.2 oz (79.9 kg)   Height: 5' 3.6" (1.615 m)   PainSc: 0-No pain 0-No pain  Body mass index is 30.63 kg/m. Wt Readings from Last 3 Encounters:  08/13/21 176 lb 3.2 oz (79.9 kg)  02/19/21 178 lb 3.2 oz (80.8 kg)  01/04/21 184 lb 12.8 oz (83.8 kg)     Medications Outpatient Encounter Medications as of 08/13/2021  Medication Sig   albuterol (VENTOLIN HFA) 108 (90 Base) MCG/ACT inhaler Inhale into the lungs every 6 (six) hours as needed for wheezing or shortness of breath.   amLODipine (NORVASC) 2.5 MG tablet TAKE 1 TABLET BY MOUTH DAILY. TAKE WITH EVENING MEAL   atorvastatin (LIPITOR) 80 MG tablet TAKE 1 TABLET BY MOUTH EVERY DAY   [EXPIRED] pneumococcal 20-valent conjugate vaccine (PREVNAR 20) 0.5 ML injection Inject 0.5 mLs into the muscle tomorrow at 10 am for 1 dose.   [DISCONTINUED] amLODipine (NORVASC) 2.5 MG tablet Take 1 tablet (2.5 mg total) by mouth daily. Take with evening meal (Patient not taking: Reported on 08/13/2021)   No facility-administered encounter medications on file as of 08/13/2021.     History: Past Medical History:  Diagnosis Date   Allergic rhinitis    Essential hypertension, benign     High cholesterol    Past Surgical History:  Procedure Laterality Date   REDUCTION MAMMAPLASTY Bilateral 2008    Family History  Problem Relation Age of Onset   Breast cancer Sister    Breast cancer Maternal Grandmother    Alzheimer's disease Mother    Bone cancer Father    Throat cancer Brother    Breast cancer Sister    Colon cancer Sister    Social History   Occupational History   Not on file  Tobacco Use   Smoking status: Never   Smokeless tobacco: Never  Vaping Use   Vaping Use: Never used  Substance and Sexual Activity   Alcohol use: Never   Drug use: Never   Sexual activity: Not on file    Tobacco Counseling Counseling given: Not Answered   Immunizations and Health Maintenance Immunization History  Administered Date(s) Administered   Fluad Quad(high Dose 65+) 08/13/2021   Influenza,inj,Quad PF,6+ Mos 06/29/2019, 07/05/2020   Moderna Sars-Covid-2 Vaccination 12/09/2019, 01/11/2020   PFIZER(Purple Top)SARS-COV-2 Vaccination 07/31/2020, 03/02/2021   PNEUMOCOCCAL CONJUGATE-20 08/14/2021   Pfizer Covid-19 Vaccine Bivalent Booster 144yr& up 08/14/2021   Tdap 09/27/2016   Zoster Recombinat (Shingrix) 09/03/2020, 11/23/2020   Health Maintenance Due  Topic Date Due   DEXA SCAN  05/09/2021    Activities of Daily Living In your present state of health, do you have any difficulty performing the following activities: 08/13/2021  Hearing? N  Vision? N  Difficulty concentrating or making decisions? N  Walking or climbing stairs? N  Dressing or bathing? N  Doing errands, shopping? N  Preparing Food and eating ? N  Using the Toilet? N  In the past six months, have you accidently leaked urine? N  Do you have problems with loss of bowel control? N  Managing your Medications? N  Managing your Finances? N  Housekeeping or managing your Housekeeping? N  Some recent data might be hidden    Physical Exam    Physical Exam Vitals and nursing note reviewed.   Constitutional:      Appearance: Normal appearance.  HENT:     Head: Normocephalic and atraumatic.     Right Ear: Tympanic membrane, ear canal and external ear normal.     Left Ear: Tympanic membrane, ear canal and external ear normal.     Nose:     Comments: Masked     Mouth/Throat:     Comments: Masked  Eyes:     Extraocular Movements: Extraocular movements intact.     Conjunctiva/sclera: Conjunctivae normal.     Pupils: Pupils are equal, round, and reactive to light.  Cardiovascular:     Rate and Rhythm: Normal rate and regular rhythm.     Pulses: Normal pulses.     Heart sounds: Normal heart sounds.  Pulmonary:     Effort: Pulmonary effort is normal.     Breath sounds: Normal breath sounds.  Chest:  Breasts:    Tanner Score is 5.     Right: Normal.     Left: Normal.  Abdominal:     General: Bowel sounds are normal.     Palpations: Abdomen is soft.  Musculoskeletal:        General: Normal range of motion.     Cervical back: Normal range of motion.  Skin:    General: Skin is warm and dry.  Neurological:     General: No focal deficit present.     Mental Status: She is alert and oriented to person, place, and time.  Psychiatric:        Mood and Affect: Mood normal.        Behavior: Behavior normal.     (optional), or other factors deemed appropriate based on the beneficiary's medical and social history and current clinical standards.  Advanced Directives: Does Patient Have a Medical Advance Directive?: Yes Type of Advance Directive: Healthcare Power of Attorney, Living will Does patient want to make changes to medical advance directive?: Yes (MAU/Ambulatory/Procedural Areas - Information given) Copy of [place] in Chart?: No - copy requested Would patient like information on creating a medical advance directive?: No - Patient declined    Assessment:    This is a routine wellness examination for this patient .   Vision/Hearing  screen Hearing Screening   '500Hz'  '1000Hz'  '2000Hz'  '4000Hz'   Right ear 40 nr 20 20  Left ear 40 40 20 20   Vision Screening   Right eye Left eye Both eyes  Without correction     With correction '20/20 20/20 20/20 '    Dietary issues and exercise activities discussed:  Current Exercise Habits: Home exercise routine, Type of exercise: walking;calisthenics, Time (Minutes): 60, Frequency (Times/Week): 7, Weekly Exercise (Minutes/Week): 420, Intensity: Moderate, Exercise limited by: None identified   Goals      Exercise 150 min/wk Moderate Activity     She would like to continue with her current regimen.        Depression  Screen PHQ 2/9 Scores 08/13/2021 07/05/2020 06/29/2019 06/30/2018  PHQ - 2 Score 0 0 0 0     Fall Risk Fall Risk  08/13/2021  Falls in the past year? 0  Number falls in past yr: 0  Injury with Fall? 0    Cognitive Function:     6CIT Screen 08/13/2021  What Year? 0 points  What month? 0 points  What time? 0 points  Count back from 20 0 points  Months in reverse 0 points  Repeat phrase 0 points  Total Score 0    Patient Care Team: Glendale Chard, MD as PCP - General (Internal Medicine)     Plan:  1. Welcome to Medicare preventive visit The annual wellness visit was performed including discussion of advanced directives, assessment of functional status and cognitive function. EKG performed as well. She will rto in one year for her next AWV with D[facility]. PATIENT IS ADVISED TO GET 30-45 MINUTES REGULAR EXERCISE NO LESS THAN FOUR TO FIVE DAYS PER WEEK - BOTH WEIGHTBEARING EXERCISES AND AEROBIC ARE RECOMMENDED.  PATIENT IS ADVISED TO FOLLOW A HEALTHY DIET WITH AT LEAST SIX FRUITS/VEGGIES PER DAY, DECREASE INTAKE OF RED MEAT, AND TO INCREASE FISH INTAKE TO TWO DAYS PER WEEK.  MEATS/FISH SHOULD NOT BE FRIED, BAKED OR BROILED IS PREFERABLE.  IT IS ALSO IMPORTANT TO CUT BACK ON YOUR SUGAR INTAKE. PLEASE AVOID ANYTHING WITH ADDED SUGAR, CORN SYRUP OR OTHER SWEETENERS.  IF YOU MUST USE A SWEETENER, YOU CAN TRY STEVIA. IT IS ALSO IMPORTANT TO AVOID ARTIFICIALLY SWEETENERS AND DIET BEVERAGES. LASTLY, I SUGGEST WEARING SPF 50 SUNSCREEN ON EXPOSED PARTS AND ESPECIALLY WHEN IN THE DIRECT SUNLIGHT FOR AN EXTENDED PERIOD OF TIME.  PLEASE AVOID FAST FOOD RESTAURANTS AND INCREASE YOUR WATER INTAKE.  2. Essential hypertension, benign Comments: Chronic, controlled. EKG performed, SB w/o acute changes. No med changes today. Encouraged to follow low sodium diet.  She will f/u in 6 months. - POCT Urinalysis Dipstick (81002) - POCT UA - Microalbumin - EKG 12-Lead - CBC - CMP14+EGFR - Lipid panel  3. Pure hypercholesterolemia Comments: Chronic, she is currently on atorvastatin 67m daily. Encouraged to live a heart healthy lifestyle.   4. Other abnormal glucose Comments: Her a1c has been elevated in the past. I will recheck this today. Encouraged to limit intake of refined carbs, sweetened beverages and diet drinks.  - Hemoglobin A1c  5. Estrogen deficiency Comments: [facility]   6. Vitamin D deficiency disease Comments: I will check vitamin D level and supplement as needed.  - Vitamin D (25 hydroxy)  7. Class 1 obesity due to excess calories with serious comorbidity and body mass index (BMI) of 30.0 to 30.9 in adult Comments: BMI is acceptable for her demographic. Encouraged to aim for at least 150 minutes of exercise per week.   8. Immunization due Comments: I will send rx [id number] her local pharmacy.  - Flu Vaccine QUAD High Dose(Fluad)  I have personally reviewed and noted the following in the patient's chart:   Medical and social history Use of alcohol, tobacco or illicit drugs  Current medications and supplements Functional ability and status Nutritional status Physical activity Advanced directives List of other physicians Hospitalizations, surgeries, and ER visits in previous 12  months Vitals Screenings to include cognitive, depression, and falls Referrals and appointments  In addition, I have reviewed and discussed with patient certain preventive protocols, quality metrics, and best practice recommendations.  A written personalized care plan for preventive services as well as general preventive health recommendations were provided to patient.     [name], MD 09/08/2021

## 2021-08-14 LAB — LIPID PANEL
Chol/HDL Ratio: 2.4 ratio (ref 0.0–4.4)
Cholesterol, Total: 175 mg/dL (ref 100–199)
HDL: 73 mg/dL (ref >39)
LDL Chol Calc (NIH): 93 mg/dL (ref 0–99)
Triglycerides: 45 mg/dL (ref 0–149)
VLDL Cholesterol Cal: 9 mg/dL (ref 5–40)

## 2021-08-14 LAB — CBC
Hematocrit: 42.2 % (ref 34.0–46.6)
Hemoglobin: 14.7 g/dL (ref 11.1–15.9)
MCH: 32.1 pg (ref 26.6–33.0)
MCHC: 34.8 g/dL (ref 31.5–35.7)
MCV: 92 fL (ref 79–97)
Platelets: 256 10*3/uL (ref 150–450)
RBC: 4.58 x10E6/uL (ref 3.77–5.28)
RDW: 12.4 % (ref 11.7–15.4)
WBC: 4 10*3/uL (ref 3.4–10.8)

## 2021-08-14 LAB — CMP14+EGFR
ALT: 44 IU/L — ABNORMAL HIGH (ref 0–32)
AST: 48 IU/L — ABNORMAL HIGH (ref 0–40)
Albumin/Globulin Ratio: 1.5 (ref 1.2–2.2)
Albumin: 4.8 g/dL (ref 3.8–4.8)
Alkaline Phosphatase: 75 IU/L (ref 44–121)
BUN/Creatinine Ratio: 11 — ABNORMAL LOW (ref 12–28)
BUN: 10 mg/dL (ref 8–27)
Bilirubin Total: 0.7 mg/dL (ref 0.0–1.2)
CO2: 26 mmol/L (ref 20–29)
Calcium: 9.7 mg/dL (ref 8.7–10.3)
Chloride: 98 mmol/L (ref 96–106)
Creatinine, Ser: 0.88 mg/dL (ref 0.57–1.00)
Globulin, Total: 3.3 g/dL (ref 1.5–4.5)
Glucose: 87 mg/dL (ref 70–99)
Potassium: 4.7 mmol/L (ref 3.5–5.2)
Sodium: 138 mmol/L (ref 134–144)
Total Protein: 8.1 g/dL (ref 6.0–8.5)
eGFR: 73 mL/min/{1.73_m2} (ref >59)

## 2021-08-14 LAB — HEMOGLOBIN A1C
Est. average glucose Bld gHb Est-mCnc: 123 mg/dL
Hgb A1c MFr Bld: 5.9 % — ABNORMAL HIGH (ref 4.8–5.6)

## 2021-08-14 LAB — VITAMIN D 25 HYDROXY (VIT D DEFICIENCY, FRACTURES): Vit D, 25-Hydroxy: 50.1 ng/mL (ref 30.0–100.0)

## 2021-09-04 ENCOUNTER — Encounter: Payer: Self-pay | Admitting: Internal Medicine

## 2021-09-04 ENCOUNTER — Other Ambulatory Visit: Payer: Self-pay

## 2021-09-04 DIAGNOSIS — E2839 Other primary ovarian failure: Secondary | ICD-10-CM

## 2021-10-16 ENCOUNTER — Encounter: Payer: Self-pay | Admitting: Internal Medicine

## 2021-11-21 ENCOUNTER — Encounter: Payer: Self-pay | Admitting: Internal Medicine

## 2021-11-22 ENCOUNTER — Ambulatory Visit (INDEPENDENT_AMBULATORY_CARE_PROVIDER_SITE_OTHER): Payer: Medicare Other | Admitting: Internal Medicine

## 2021-11-22 ENCOUNTER — Other Ambulatory Visit: Payer: Self-pay

## 2021-11-22 ENCOUNTER — Encounter: Payer: Self-pay | Admitting: Internal Medicine

## 2021-11-22 VITALS — BP 112/80 | HR 65 | Temp 98.1°F | Ht 63.8 in | Wt 182.2 lb

## 2021-11-22 DIAGNOSIS — Z6831 Body mass index (BMI) 31.0-31.9, adult: Secondary | ICD-10-CM | POA: Diagnosis not present

## 2021-11-22 DIAGNOSIS — E6609 Other obesity due to excess calories: Secondary | ICD-10-CM | POA: Diagnosis not present

## 2021-11-22 DIAGNOSIS — I10 Essential (primary) hypertension: Secondary | ICD-10-CM

## 2021-11-22 DIAGNOSIS — M25511 Pain in right shoulder: Secondary | ICD-10-CM

## 2021-11-22 MED ORDER — MELOXICAM 7.5 MG PO TABS: ORAL_TABLET | ORAL | 0 refills | Status: DC

## 2021-11-22 NOTE — Progress Notes (Signed)
Jeri Cos [place],acting as a Neurosurgeon for Gwynneth Aliment, MD.,have documented all relevant documentation on the behalf of Gwynneth Aliment, MD,as directed by  Gwynneth Aliment, MD while in the presence of Gwynneth Aliment, MD.  This visit occurred during the SARS-CoV-2 public health emergency.  Safety protocols were in place, including screening questions prior to the visit, additional usage of staff PPE, and extensive cleaning of exam room while observing appropriate contact time as indicated for disinfecting solutions.  Subjective:     Patient ID: Ruth Davis , female    DOB: [birth year] , 66 y.o.   MRN: [id number]   Chief Complaint  Patient presents with   Shoulder Pain    HPI  Patient presents today for right shoulder pain. She reports the pain has been going on for 3 weeks now. The pain is sharp and is worse at night. Patient feels she may be feeling the pain from working out. She states she has been taking a [employer] class and admits she may have overdone it. She has taken Tylenol with minimal relief of her sx.   Shoulder Pain  The pain is present in the right shoulder. This is a new problem. The current episode started 1 to 4 weeks ago. The quality of the pain is described as sharp. The pain is moderate. Pertinent negatives include no tingling. The symptoms are aggravated by activity. Treatments tried: she stopped working out.    Past Medical History:  Diagnosis Date   Allergic rhinitis    Essential hypertension, benign    High cholesterol      Family History  Problem Relation Age of Onset   Breast cancer Sister    Breast cancer Maternal Grandmother    Alzheimer's disease Mother    Bone cancer Father    Throat cancer Brother    Breast cancer Sister    Colon cancer Sister      Current Outpatient Medications:    albuterol (VENTOLIN HFA) 108 (90 Base) MCG/ACT inhaler, Inhale into the lungs every 6 (six) hours as needed for wheezing or shortness of breath., Disp:  , Rfl:    amLODipine (NORVASC) 2.5 MG tablet, TAKE 1 TABLET BY MOUTH DAILY. TAKE WITH EVENING MEAL, Disp: 90 tablet, Rfl: 2   atorvastatin (LIPITOR) 80 MG tablet, TAKE 1 TABLET BY MOUTH EVERY DAY, Disp: 90 tablet, Rfl: 1   meloxicam (MOBIC) 7.5 MG tablet, One tab po bid prn shoulder pain, Disp: 60 tablet, Rfl: 0   Allergies  Allergen Reactions   Acetaminophen Other (See Comments)    Hives/swelling   Fomepizole Other (See Comments)    Hives/swelling     Review of Systems  Constitutional: Negative.   Respiratory: Negative.    Cardiovascular: Negative.   Gastrointestinal: Negative.   Musculoskeletal:  Positive for arthralgias.  Neurological:  Negative for tingling.  Psychiatric/Behavioral: Negative.      Today's Vitals   11/22/21 1203  BP: 112/80  Pulse: 65  Temp: 98.1 F (36.7 C)  Weight: 182 lb 3.2 oz (82.6 kg)  Height: 5' 3.8" (1.621 m)  PainSc: 6   PainLoc: Shoulder   Body mass index is 31.47 kg/m.  Wt Readings from Last 3 Encounters:  11/22/21 182 lb 3.2 oz (82.6 kg)  08/13/21 176 lb 3.2 oz (79.9 kg)  02/19/21 178 lb 3.2 oz (80.8 kg)     Objective:  Physical Exam Vitals and nursing note reviewed.  Constitutional:      Appearance: Normal appearance.  HENT:  Head: Normocephalic and atraumatic.     Nose:     Comments: Masked     Mouth/Throat:     Comments: Masked  Eyes:     Extraocular Movements: Extraocular movements intact.  Cardiovascular:     Rate and Rhythm: Normal rate and regular rhythm.     Heart sounds: Normal heart sounds.  Pulmonary:     Effort: Pulmonary effort is normal.     Breath sounds: Normal breath sounds.  Musculoskeletal:        General: Tenderness present.     Cervical back: Normal range of motion.  Skin:    General: Skin is warm.  Neurological:     General: No focal deficit present.     Mental Status: She is alert.  Psychiatric:        Mood and Affect: Mood normal.        Behavior: Behavior normal.        Assessment And  Plan:     1. Acute pain of right shoulder Comments: I will send rx meloxicam 7.5mg  to take BID prn. I will refer her to Ortho for further evaluation and radiographic studies.  - Ambulatory referral to Orthopedic Surgery  2. Essential hypertension, benign Comments: Chronic, well controlled. No med changes. She will rto in May 2023 as previously scheduled.   3. Class 1 obesity due to excess calories with serious comorbidity and body mass index (BMI) of 31.0 to 31.9 in adult Comments: She is aware of 6 lb weight gain since Nov 2022. She is encouraged to aim for at least 150 minutes of exercise per week.    Patient was given opportunity to ask questions. Patient verbalized understanding of the plan and was able to repeat key elements of the plan. All questions were answered to their satisfaction.   I, Gwynneth Aliment, MD, have reviewed all documentation for this visit. The documentation on 11/22/21 for the exam, diagnosis, procedures, and orders are all accurate and complete.   IF YOU HAVE BEEN REFERRED TO A SPECIALIST, IT MAY TAKE 1-2 WEEKS TO SCHEDULE/PROCESS THE REFERRAL. IF YOU HAVE NOT HEARD FROM US/SPECIALIST IN TWO WEEKS, PLEASE GIVE [place] A CALL AT [phone].   THE PATIENT IS ENCOURAGED TO PRACTICE SOCIAL DISTANCING DUE TO THE COVID-19 PANDEMIC.

## 2021-11-27 ENCOUNTER — Ambulatory Visit: Payer: Medicare Other

## 2021-11-27 ENCOUNTER — Other Ambulatory Visit: Payer: Self-pay

## 2021-11-27 ENCOUNTER — Ambulatory Visit (INDEPENDENT_AMBULATORY_CARE_PROVIDER_SITE_OTHER): Payer: Medicare Other | Admitting: Orthopaedic Surgery

## 2021-11-27 ENCOUNTER — Encounter: Payer: Self-pay | Admitting: Orthopaedic Surgery

## 2021-11-27 DIAGNOSIS — G8929 Other chronic pain: Secondary | ICD-10-CM

## 2021-11-27 DIAGNOSIS — M25511 Pain in right shoulder: Secondary | ICD-10-CM | POA: Diagnosis not present

## 2021-11-27 NOTE — Progress Notes (Signed)
Office Visit Note   Patient: Ruth Davis           Date of Birth: 1955-11-13           MRN: [id number] Visit Date: 11/27/2021              Requested by: Dorothyann Peng, MD [address] [place],  [place] [address] PCP: Dorothyann Peng, MD   Assessment & Plan: Visit Diagnoses:  1. Chronic right shoulder pain     Plan: Impression is chronic right shoulder pain.  I suspect that she is experiencing rotator cuff tendinitis from overuse.  We will have her back off on activity and continue take meloxicam for another few weeks.  She declined injection today as the pain is not severe enough for this.  We will see her back as needed.  Follow-Up Instructions: No follow-ups on file.   Orders:  Orders Placed This Encounter  Procedures   XR Shoulder Right   No orders of the defined types were placed in this encounter.     Procedures: No procedures performed   Clinical Data: No additional findings.   Subjective: Chief Complaint  Patient presents with   Right Shoulder - Pain    HPI  Ruth Davis is a very pleasant 66 year old female comes in for evaluation of right shoulder pain for about 3 weeks.  Denies any injuries but she feels that this may have been caused by starting a new class at the gym in which she requires quite a bit of weight lifting.  She feels like there may have felt something at that time.  Meloxicam does help which was prescribed by her PCP a week ago.  The pain is nagging ache that is worse at night.  She is right-hand dominant.  She has not noticed any restriction in range of motion.  Denies any radicular symptoms.  Review of Systems  Constitutional: Negative.   HENT: Negative.    Eyes: Negative.   Respiratory: Negative.    Cardiovascular: Negative.   Endocrine: Negative.   Musculoskeletal: Negative.   Neurological: Negative.   Hematological: Negative.   Psychiatric/Behavioral: Negative.    All other systems reviewed and are  negative.   Objective: Vital Signs: There were no vitals taken for this visit.  Physical Exam Vitals and nursing note reviewed.  Constitutional:      Appearance: She is well-developed.  HENT:     Head: Normocephalic and atraumatic.  Pulmonary:     Effort: Pulmonary effort is normal.  Abdominal:     Palpations: Abdomen is soft.  Musculoskeletal:     Cervical back: Neck supple.  Skin:    General: Skin is warm.     Capillary Refill: Capillary refill takes less than 2 seconds.  Neurological:     Mental Status: She is alert and oriented to person, place, and time.  Psychiatric:        Behavior: Behavior normal.        Thought Content: Thought content normal.        Judgment: Judgment normal.    Ortho Exam  Examination of the right shoulder shows normal range of motion.  Strength is normal.  Slight discomfort with testing of the rotator cuff.  No real pain with impingement or with O'Brien's test.  AC joint is not tender.  Negative cross body adduction.  Specialty Comments:  No specialty comments available.  Imaging: XR Shoulder Right  Result Date: 11/27/2021 Slight arthritic changes to the [facility] joint.  PMFS History: Patient Active Problem List   Diagnosis Date Noted   Pure hypercholesterolemia 09/10/2018   Abnormal thyroid blood test 09/10/2018   Past Medical History:  Diagnosis Date   Allergic rhinitis    Essential hypertension, benign    High cholesterol     Family History  Problem Relation Age of Onset   Breast cancer Sister    Breast cancer Maternal Grandmother    Alzheimer's disease Mother    Bone cancer Father    Throat cancer Brother    Breast cancer Sister    Colon cancer Sister     Past Surgical History:  Procedure Laterality Date   REDUCTION MAMMAPLASTY Bilateral 2008   Social History   Occupational History   Not on file  Tobacco Use   Smoking status: Never   Smokeless tobacco: Never  Vaping Use   Vaping Use: Never used  Substance and  Sexual Activity   Alcohol use: Never   Drug use: Never   Sexual activity: Not on file

## 2021-12-19 ENCOUNTER — Other Ambulatory Visit: Payer: Self-pay | Admitting: Internal Medicine

## 2021-12-29 ENCOUNTER — Other Ambulatory Visit: Payer: Self-pay | Admitting: Internal Medicine

## 2022-02-07 ENCOUNTER — Ambulatory Visit
Admission: RE | Admit: 2022-02-07 | Discharge: 2022-02-07 | Disposition: A | Discharge status: Home / Self Care | Payer: Medicare Other | Source: Ambulatory Visit | Attending: Internal Medicine | Admitting: Internal Medicine

## 2022-02-07 DIAGNOSIS — E2839 Other primary ovarian failure: Secondary | ICD-10-CM

## 2022-02-11 ENCOUNTER — Other Ambulatory Visit: Payer: Self-pay | Admitting: Internal Medicine

## 2022-02-14 ENCOUNTER — Ambulatory Visit: Payer: Medicare Other | Admitting: Internal Medicine

## 2022-09-04 ENCOUNTER — Ambulatory Visit (INDEPENDENT_AMBULATORY_CARE_PROVIDER_SITE_OTHER): Payer: Medicare Other

## 2022-09-04 VITALS — BP 124/80 | HR 60 | Temp 97.9°F | Ht 63.6 in | Wt 183.6 lb

## 2022-09-04 DIAGNOSIS — Z0000 Encounter for general adult medical examination without abnormal findings: Secondary | ICD-10-CM | POA: Diagnosis not present

## 2022-09-04 NOTE — Progress Notes (Signed)
Subjective:   Ruth Davis is a 66 y.o. female who presents for an Initial Medicare Annual Wellness Visit.  Review of Systems     Cardiac Risk Factors include: advanced age (>84men, >31 women);obesity (BMI >30kg/m2)     Objective:    Today's Vitals   09/04/22 1055 09/04/22 1115  BP: (!) 140/80 124/80  Pulse: 60   Temp: 97.9 F (36.6 C)   TempSrc: Oral   SpO2: 93%   Weight: 183 lb 9.6 oz (83.3 kg)   Height: 5' 3.6" (1.615 m)    Body mass index is 31.91 kg/m.     09/04/2022   11:04 AM 08/13/2021   12:22 PM  Advanced Directives  Does Patient Have a Medical Advance Directive? Yes Yes  Type of Advance Directive Living will [place];Living will  Does patient want to make changes to medical advance directive?  Yes (MAU/Ambulatory/Procedural Areas - Information given)  Copy of [place] in Chart?  No - copy requested  Would patient like information on creating a medical advance directive?  No - Patient declined    Current Medications (verified) Outpatient Encounter Medications as of 09/04/2022  Medication Sig   albuterol (VENTOLIN HFA) 108 (90 Base) MCG/ACT inhaler Inhale into the lungs every 6 (six) hours as needed for wheezing or shortness of breath.   amLODipine (NORVASC) 2.5 MG tablet TAKE 1 TABLET (2.5 MG TOTAL) BY MOUTH DAILY. TAKE WITH EVENING MEAL   atorvastatin (LIPITOR) 80 MG tablet TAKE 1 TABLET BY MOUTH EVERY DAY   meloxicam (MOBIC) 7.5 MG tablet TAKE 1 TABLET TWICE A DAY AS NEEDED FOR SHOULDER PAIN   No facility-administered encounter medications on file as of 09/04/2022.    Allergies (verified) Acetaminophen and Fomepizole   History: Past Medical History:  Diagnosis Date   Allergic rhinitis    Essential hypertension, benign    High cholesterol    Past Surgical History:  Procedure Laterality Date   REDUCTION MAMMAPLASTY Bilateral 2008   Family History  Problem Relation Age of Onset   Breast cancer  Sister    Breast cancer Maternal Grandmother    Alzheimer's disease Mother    Bone cancer Father    Throat cancer Brother    Breast cancer Sister    Colon cancer Sister    Social History   Socioeconomic History   Marital status: Married    Spouse name: Not on file   Number of children: Not on file   Years of education: Not on file   Highest education level: Not on file  Occupational History   Not on file  Tobacco Use   Smoking status: Never   Smokeless tobacco: Never  Vaping Use   Vaping Use: Never used  Substance and Sexual Activity   Alcohol use: Never   Drug use: Never   Sexual activity: Not on file  Other Topics Concern   Not on file  Social History Narrative   Not on file   Social Determinants of Health   Financial Resource Strain: Low Risk  (09/04/2022)   Overall Financial Resource Strain (CARDIA)    Difficulty of Paying Living Expenses: Not hard at all  Food Insecurity: No Food Insecurity (09/04/2022)   Hunger Vital Sign    Worried About Running Out of Food in the Last Year: Never true    Ran Out of Food in the Last Year: Never true  Transportation Needs: No Transportation Needs (09/04/2022)   [place] - Transportation  Lack of Transportation (Medical): No    Lack of Transportation (Non-Medical): No  Physical Activity: Sufficiently Active (09/04/2022)   Exercise Vital Sign    Days of Exercise per Week: 7 days    Minutes of Exercise per Session: 90 min  Stress: No Stress Concern Present (09/04/2022)   [place]    Feeling of Stress : Not at all  Social Connections: Not on file    Tobacco Counseling Counseling given: Not Answered   Clinical Intake:  Pre-visit preparation completed: Yes  Pain : No/denies pain     Nutritional Status: BMI > 30  Obese Nutritional Risks: None Diabetes: No  How often do you need to have someone help you when you read instructions, pamphlets, or other  written materials from your doctor or pharmacy?: 1 - Never What is the last grade level you completed in school?: PhD  Diabetic? no  Interpreter Needed?: No  Information entered by :: NAllen LPN   Activities of Daily Living    09/04/2022   11:04 AM  In your present state of health, do you have any difficulty performing the following activities:  Hearing? 0  Vision? 0  Difficulty concentrating or making decisions? 0  Walking or climbing stairs? 0  Dressing or bathing? 0  Doing errands, shopping? 0  Preparing Food and eating ? N  Using the Toilet? N  In the past six months, have you accidently leaked urine? N  Do you have problems with loss of bowel control? N  Managing your Medications? N  Managing your Finances? N  Housekeeping or managing your Housekeeping? N    Patient Care Team: Glendale Chard, MD as PCP - General (Internal Medicine)  Indicate any recent Medical Services you may have received from other than [facility] providers in the past year (date may be approximate).     Assessment:   This is a routine wellness examination for [place].  Hearing/Vision screen Vision Screening - Comments:: Regular eye exams, [facility]  Dietary issues and exercise activities discussed: Current Exercise Habits: Home exercise routine, Type of exercise: walking;calisthenics, Time (Minutes): > 60, Frequency (Times/Week): 7, Weekly Exercise (Minutes/Week): 0   Goals Addressed             This Visit's Progress    Patient Stated       09/04/2022, wants to lose 10 pounds       Depression Screen    09/04/2022   11:04 AM 08/13/2021   11:40 AM 07/05/2020    9:48 AM 06/29/2019    9:10 AM 06/30/2018    8:50 AM  PHQ 2/9 Scores  PHQ - 2 Score 0 0 0 0 0    Fall Risk    09/04/2022   11:04 AM 08/13/2021   11:40 AM 06/29/2019    9:10 AM  Fall Risk   Falls in the past year? 0 0 0  Number falls in past yr: 0 0   Injury with Fall? 0 0   Risk for fall due to : Medication side  effect    Follow up Falls prevention discussed;Education provided;Falls evaluation completed      FALL RISK PREVENTION PERTAINING TO THE HOME:  Any stairs in or around the home? Yes  If so, are there any without handrails? Yes  Home free of loose throw rugs in walkways, pet beds, electrical cords, etc? Yes  Adequate lighting in your home to reduce risk of falls? Yes   ASSISTIVE  DEVICES UTILIZED TO PREVENT FALLS:  Life alert? No  Use of a cane, walker or w/c? No  Grab bars in the bathroom? No  Shower chair or bench in shower? No  Elevated toilet seat or a handicapped toilet? No   TIMED UP AND GO:  Was the test performed? Yes .  Length of time to ambulate 10 feet: 5 sec.   Gait steady and fast without use of assistive device  Cognitive Function:        09/04/2022   11:05 AM 08/13/2021   11:41 AM  6CIT Screen  What Year? 0 points 0 points  What month? 0 points 0 points  What time? 0 points 0 points  Count back from 20 0 points 0 points  Months in reverse 0 points 0 points  Repeat phrase 0 points 0 points  Total Score 0 points 0 points    Immunizations Immunization History  Administered Date(s) Administered   COVID-19, mRNA, vaccine(Comirnaty)12 years and older 07/15/2022   Fluad Quad(high Dose 65+) 08/13/2021, 07/15/2022   Influenza,inj,Quad PF,6+ Mos 06/29/2019, 07/05/2020   Moderna Sars-Covid-2 Vaccination 12/09/2019, 01/11/2020   PFIZER(Purple Top)SARS-COV-2 Vaccination 07/31/2020, 03/02/2021   PNEUMOCOCCAL CONJUGATE-20 08/14/2021   Pfizer Covid-19 Vaccine Bivalent Booster 98yrs & up 08/14/2021   Tdap 09/27/2016   Zoster Recombinat (Shingrix) 09/03/2020, 11/23/2020    TDAP status: Up to date  Flu Vaccine status: Up to date  Pneumococcal vaccine status: Up to date  Covid-19 vaccine status: Completed vaccines  Qualifies for Shingles Vaccine? Yes   Zostavax completed Yes   Shingrix Completed?: Yes  Screening Tests Health Maintenance  Topic Date Due    MAMMOGRAM  09/26/2022   Medicare Annual Wellness (AWV)  09/05/2023   COLONOSCOPY (Pts 45-85yrs Insurance coverage will need to be confirmed)  07/31/2026   DTaP/Tdap/Td (2 - Td or Tdap) 09/27/2026   Pneumonia Vaccine 24+ Years old  Completed   INFLUENZA VACCINE  Completed   DEXA SCAN  Completed   COVID-19 Vaccine  Completed   Hepatitis C Screening  Completed   Zoster Vaccines- Shingrix  Completed   HPV VACCINES  Aged Out    Health Maintenance  There are no preventive care reminders to display for this patient.   Colorectal cancer screening: Type of screening: Colonoscopy. Completed 07/31/2016. Repeat every 10 years  Mammogram status: Completed 09/2021. Repeat every year  Bone Density status: Completed 02/07/2022.   Lung Cancer Screening: (Low Dose CT Chest recommended if Age 35-80 years, 30 pack-year currently smoking OR have quit w/in 15years.) does not qualify.   Lung Cancer Screening Referral: no  Additional Screening:  Hepatitis C Screening: does qualify; Completed 11/08/2014  Vision Screening: Recommended annual ophthalmology exams for early detection of glaucoma and other disorders of the eye. Is the patient up to date with their annual eye exam?  Yes  Who is the provider or what is the name of the office in which the patient attends annual eye exams? [facility] If pt is not established with a provider, would they like to be referred to a provider to establish care? No .   Dental Screening: Recommended annual dental exams for proper oral hygiene  Community Resource Referral / Chronic Care Management: CRR required this visit?  No   CCM required this visit?  No      Plan:     I have personally reviewed and noted the following in the patient's chart:   Medical and social history Use of alcohol, tobacco or illicit drugs  Current medications and supplements including opioid prescriptions. Patient is not currently taking opioid prescriptions. Functional  ability and status Nutritional status Physical activity Advanced directives List of other physicians Hospitalizations, surgeries, and ER visits in previous 12 months Vitals Screenings to include cognitive, depression, and falls Referrals and appointments  In addition, I have reviewed and discussed with patient certain preventive protocols, quality metrics, and best practice recommendations. A written personalized care plan for preventive services as well as general preventive health recommendations were provided to patient.     Kellie Simmering, LPN   [id number]   Nurse Notes: none

## 2022-09-04 NOTE — Patient Instructions (Addendum)
Ruth Davis , Thank you for taking time to come for your Medicare Wellness Visit. I appreciate your ongoing commitment to your health goals. Please review the following plan we discussed and let me know if I can assist you in the future.   These are the goals we discussed:  Goals      Exercise 150 min/wk Moderate Activity     She would like to continue with her current regimen.      Patient Stated     09/04/2022, wants to lose 10 pounds        This is a list of the screening recommended for you and due dates:  Health Maintenance  Topic Date Due   Mammogram  09/26/2022   Medicare Annual Wellness Visit  09/05/2023   Colon Cancer Screening  07/31/2026   DTaP/Tdap/Td vaccine (2 - Td or Tdap) 09/27/2026   Pneumonia Vaccine  Completed   Flu Shot  Completed   DEXA scan (bone density measurement)  Completed   COVID-19 Vaccine  Completed   Hepatitis C Screening: USPSTF Recommendation to screen - Ages 83-79 yo.  Completed   Zoster (Shingles) Vaccine  Completed   HPV Vaccine  Aged Out    Advanced directives: Please bring a copy of your POA (Power of [place]) and/or Living Will to your next appointment.   Conditions/risks identified: none  Next appointment: Follow up in one year for your annual wellness visit    Preventive Care 65 Years and Older, Female Preventive care refers to lifestyle choices and visits with your health care provider that can promote health and wellness. What does preventive care include? A yearly physical exam. This is also called an annual well check. Dental exams once or twice a year. Routine eye exams. Ask your health care provider how often you should have your eyes checked. Personal lifestyle choices, including: Daily care of your teeth and gums. Regular physical activity. Eating a healthy diet. Avoiding tobacco and drug use. Limiting alcohol use. Practicing safe sex. Taking low-dose aspirin every day. Taking vitamin and mineral supplements as  recommended by your health care provider. What happens during an annual well check? The services and screenings done by your health care provider during your annual well check will depend on your age, overall health, lifestyle risk factors, and family history of disease. Counseling  Your health care provider may ask you questions about your: Alcohol use. Tobacco use. Drug use. Emotional well-being. Home and relationship well-being. Sexual activity. Eating habits. History of falls. Memory and ability to understand (cognition). Work and work Astronomer. Reproductive health. Screening  You may have the following tests or measurements: Height, weight, and BMI. Blood pressure. Lipid and cholesterol levels. These may be checked every 5 years, or more frequently if you are over 55 years old. Skin check. Lung cancer screening. You may have this screening every year starting at age 32 if you have a 30-pack-year history of smoking and currently smoke or have quit within the past 15 years. Fecal occult blood test (FOBT) of the stool. You may have this test every year starting at age 62. Flexible sigmoidoscopy or colonoscopy. You may have a sigmoidoscopy every 5 years or a colonoscopy every 10 years starting at age 21. Hepatitis C blood test. Hepatitis B blood test. Sexually transmitted disease (STD) testing. Diabetes screening. This is done by checking your blood sugar (glucose) after you have not eaten for a while (fasting). You may have this done every 1-3 years. Bone density scan. This  is done to screen for osteoporosis. You may have this done starting at age 60. Mammogram. This may be done every 1-2 years. Talk to your health care provider about how often you should have regular mammograms. Talk with your health care provider about your test results, treatment options, and if necessary, the need for more tests. Vaccines  Your health care provider may recommend certain vaccines, such  as: Influenza vaccine. This is recommended every year. Tetanus, diphtheria, and acellular pertussis (Tdap, Td) vaccine. You may need a Td booster every 10 years. Zoster vaccine. You may need this after age 45. Pneumococcal 13-valent conjugate (PCV13) vaccine. One dose is recommended after age 67. Pneumococcal polysaccharide (PPSV23) vaccine. One dose is recommended after age 27. Talk to your health care provider about which screenings and vaccines you need and how often you need them. This information is not intended to replace advice given to you by your health care provider. Make sure you discuss any questions you have with your health care provider. Document Released: 10/13/2015 Document Revised: 06/05/2016 Document Reviewed: 07/18/2015 Elsevier Interactive Patient Education  2017 [employer] Prevention in the Home Falls can cause injuries. They can happen to people of all ages. There are many things you can do to make your home safe and to help prevent falls. What can I do on the outside of my home? Regularly fix the edges of walkways and driveways and fix any cracks. Remove anything that might make you trip as you walk through a door, such as a raised step or threshold. Trim any bushes or trees on the path to your home. Use bright outdoor lighting. Clear any walking paths of anything that might make someone trip, such as rocks or tools. Regularly check to see if handrails are loose or broken. Make sure that both sides of any steps have handrails. Any raised decks and porches should have guardrails on the edges. Have any leaves, snow, or ice cleared regularly. Use sand or salt on walking paths during winter. Clean up any spills in your garage right away. This includes oil or grease spills. What can I do in the bathroom? Use night lights. Install grab bars by the toilet and in the tub and shower. Do not use towel bars as grab bars. Use non-skid mats or decals in the tub or  shower. If you need to sit down in the shower, use a plastic, non-slip stool. Keep the floor dry. Clean up any water that spills on the floor as soon as it happens. Remove soap buildup in the tub or shower regularly. Attach bath mats securely with double-sided non-slip rug tape. Do not have throw rugs and other things on the floor that can make you trip. What can I do in the bedroom? Use night lights. Make sure that you have a light by your bed that is easy to reach. Do not use any sheets or blankets that are too big for your bed. They should not hang down onto the floor. Have a firm chair that has side arms. You can use this for support while you get dressed. Do not have throw rugs and other things on the floor that can make you trip. What can I do in the kitchen? Clean up any spills right away. Avoid walking on wet floors. Keep items that you use a lot in easy-to-reach places. If you need to reach something above you, use a strong step stool that has a grab bar. Keep electrical cords out  of the way. Do not use floor polish or wax that makes floors slippery. If you must use wax, use non-skid floor wax. Do not have throw rugs and other things on the floor that can make you trip. What can I do with my stairs? Do not leave any items on the stairs. Make sure that there are handrails on both sides of the stairs and use them. Fix handrails that are broken or loose. Make sure that handrails are as long as the stairways. Check any carpeting to make sure that it is firmly attached to the stairs. Fix any carpet that is loose or worn. Avoid having throw rugs at the top or bottom of the stairs. If you do have throw rugs, attach them to the floor with carpet tape. Make sure that you have a light switch at the top of the stairs and the bottom of the stairs. If you do not have them, ask someone to add them for you. What else can I do to help prevent falls? Wear shoes that: Do not have high heels. Have  rubber bottoms. Are comfortable and fit you well. Are closed at the toe. Do not wear sandals. If you use a stepladder: Make sure that it is fully opened. Do not climb a closed stepladder. Make sure that both sides of the stepladder are locked into place. Ask someone to hold it for you, if possible. Clearly mark and make sure that you can see: Any grab bars or handrails. First and last steps. Where the edge of each step is. Use tools that help you move around (mobility aids) if they are needed. These include: Canes. Walkers. Scooters. Crutches. Turn on the lights when you go into a dark area. Replace any light bulbs as soon as they burn out. Set up your furniture so you have a clear path. Avoid moving your furniture around. If any of your floors are uneven, fix them. If there are any pets around you, be aware of where they are. Review your medicines with your doctor. Some medicines can make you feel dizzy. This can increase your chance of falling. Ask your doctor what other things that you can do to help prevent falls. This information is not intended to replace advice given to you by your health care provider. Make sure you discuss any questions you have with your health care provider. Document Released: 07/13/2009 Document Revised: 02/22/2016 Document Reviewed: 10/21/2014 Elsevier Interactive Patient Education  2017 [employer].

## 2022-09-26 ENCOUNTER — Other Ambulatory Visit: Payer: Self-pay | Admitting: Internal Medicine

## 2022-09-26 LAB — HM MAMMOGRAPHY: HM Mammogram: NORMAL (ref 0–4)

## 2022-10-07 ENCOUNTER — Encounter: Payer: Self-pay | Admitting: Internal Medicine

## 2022-10-17 ENCOUNTER — Other Ambulatory Visit: Payer: Self-pay

## 2022-10-17 ENCOUNTER — Encounter: Payer: Self-pay | Admitting: Internal Medicine

## 2022-10-17 ENCOUNTER — Ambulatory Visit (INDEPENDENT_AMBULATORY_CARE_PROVIDER_SITE_OTHER): Payer: Medicare Other | Admitting: Internal Medicine

## 2022-10-17 VITALS — BP 124/84 | HR 52 | Temp 98.3°F | Ht 63.0 in | Wt 186.0 lb

## 2022-10-17 DIAGNOSIS — E6609 Other obesity due to excess calories: Secondary | ICD-10-CM | POA: Insufficient documentation

## 2022-10-17 DIAGNOSIS — E559 Vitamin D deficiency, unspecified: Secondary | ICD-10-CM

## 2022-10-17 DIAGNOSIS — R7309 Other abnormal glucose: Secondary | ICD-10-CM

## 2022-10-17 DIAGNOSIS — E7800 Pure hypercholesterolemia, unspecified: Secondary | ICD-10-CM | POA: Diagnosis not present

## 2022-10-17 DIAGNOSIS — I10 Essential (primary) hypertension: Secondary | ICD-10-CM | POA: Diagnosis not present

## 2022-10-17 DIAGNOSIS — Z6832 Body mass index (BMI) 32.0-32.9, adult: Secondary | ICD-10-CM

## 2022-10-17 MED ORDER — AMLODIPINE BESYLATE 2.5 MG PO TABS: 2.5000 mg | ORAL_TABLET | Freq: Every day | ORAL | 0 refills | Status: DC

## 2022-10-17 MED ORDER — ATORVASTATIN CALCIUM 80 MG PO TABS: 80.0000 mg | ORAL_TABLET | Freq: Every day | ORAL | 1 refills | Status: DC

## 2022-10-17 MED ORDER — AMLODIPINE BESYLATE 2.5 MG PO TABS: 2.5000 mg | ORAL_TABLET | Freq: Every day | ORAL | 2 refills | Status: DC

## 2022-10-17 NOTE — Patient Instructions (Signed)
Hypertension, Adult Hypertension is another name for high blood pressure. High blood pressure forces your heart to work harder to pump blood. This can cause problems over time. There are two numbers in a blood pressure reading. There is a top number (systolic) over a bottom number (diastolic). It is best to have a blood pressure that is below 120/80. What are the causes? The cause of this condition is not known. Some other conditions can lead to high blood pressure. What increases the risk? Some lifestyle factors can make you more likely to develop high blood pressure: Smoking. Not getting enough exercise or physical activity. Being overweight. Having too much fat, sugar, calories, or salt (sodium) in your diet. Drinking too much alcohol. Other risk factors include: Having any of these conditions: Heart disease. Diabetes. High cholesterol. Kidney disease. Obstructive sleep apnea. Having a family history of high blood pressure and high cholesterol. Age. The risk increases with age. Stress. What are the signs or symptoms? High blood pressure may not cause symptoms. Very high blood pressure (hypertensive crisis) may cause: Headache. Fast or uneven heartbeats (palpitations). Shortness of breath. Nosebleed. Vomiting or feeling like you may vomit (nauseous). Changes in how you see. Very bad chest pain. Feeling dizzy. Seizures. How is this treated? This condition is treated by making healthy lifestyle changes, such as: Eating healthy foods. Exercising more. Drinking less alcohol. Your doctor may prescribe medicine if lifestyle changes do not help enough and if: Your top number is above 130. Your bottom number is above 80. Your personal target blood pressure may vary. Follow these instructions at home: Eating and drinking  If told, follow the DASH eating plan. To follow this plan: Fill one half of your plate at each meal with fruits and vegetables. Fill one fourth of your plate  at each meal with whole grains. Whole grains include whole-wheat pasta, brown rice, and whole-grain bread. Eat or drink low-fat dairy products, such as skim milk or low-fat yogurt. Fill one fourth of your plate at each meal with low-fat (lean) proteins. Low-fat proteins include fish, chicken without skin, eggs, beans, and tofu. Avoid fatty meat, cured and processed meat, or chicken with skin. Avoid pre-made or processed food. Limit the amount of salt in your diet to less than 1,500 mg each day. Do not drink alcohol if: Your doctor tells you not to drink. You are pregnant, may be pregnant, or are planning to become pregnant. If you drink alcohol: Limit how much you have to: 0-1 drink a day for women. 0-2 drinks a day for men. Know how much alcohol is in your drink. In the [place] one drink equals one 12 oz bottle of beer (355 mL), one 5 oz glass of wine (148 mL), or one 1 oz glass of hard liquor (44 mL). Lifestyle  Work with your doctor to stay at a healthy weight or to lose weight. Ask your doctor what the best weight is for you. Get at least 30 minutes of exercise that causes your heart to beat faster (aerobic exercise) most days of the week. This may include walking, swimming, or biking. Get at least 30 minutes of exercise that strengthens your muscles (resistance exercise) at least 3 days a week. This may include lifting weights or doing Pilates. Do not smoke or use any products that contain nicotine or tobacco. If you need help quitting, ask your doctor. Check your blood pressure at home as told by your doctor. Keep all follow-up visits. Medicines Take over-the-counter and prescription medicines  only as told by your doctor. Follow directions carefully. ?Do not skip doses of blood pressure medicine. The medicine does not work as well if you skip doses. Skipping doses also puts you at risk for problems. ?Ask your doctor about side effects or reactions to medicines that you should watch  for. ?Contact a doctor if: ?You think you are having a reaction to the medicine you are taking. ?You have headaches that keep coming back. ?You feel dizzy. ?You have swelling in your ankles. ?You have trouble with your vision. ?Get help right away if: ?You get a very bad headache. ?You start to feel mixed up (confused). ?You feel weak or numb. ?You feel faint. ?You have very bad pain in your: ?Chest. ?Belly (abdomen). ?You vomit more than once. ?You have trouble breathing. ?These symptoms may be an emergency. Get help right away. Call 911. ?Do not wait to see if the symptoms will go away. ?Do not drive yourself to the hospital. ?Summary ?Hypertension is another name for high blood pressure. ?High blood pressure forces your heart to work harder to pump blood. ?For most people, a normal blood pressure is less than 120/80. ?Making healthy choices can help lower blood pressure. If your blood pressure does not get lower with healthy choices, you may need to take medicine. ?This information is not intended to replace advice given to you by your health care provider. Make sure you discuss any questions you have with your health care provider. ?Document Revised: 07/05/2021 Document Reviewed: 07/05/2021 ?Elsevier Patient Education ? 2023 Elsevier Inc. ? ?

## 2022-10-17 NOTE — Progress Notes (Signed)
I,Ruth Davis,acting as a scribe for Ruth Greenland, MD.,have documented all relevant documentation on the behalf of Ruth Greenland, MD,as directed by  Ruth Greenland, MD while in the presence of Ruth Greenland, MD.    Subjective:     Patient ID: Ruth Davis , female    DOB: [birth year] , 67 y.o.   MRN: [id number]   Chief Complaint  Patient presents with   Hypertension   Hyperlipidemia    HPI  She presents today for f/u hypertension and cholesterol.  She reports compliance with meds. She denies headaches, chest pain and shortness of breath. Last time patient was seen here in the office: 11/22/2021.   She has no specific questions or concerns. She is also back at work! Her expertise was needed at [employer].   Hypertension This is a new problem. The current episode started 1 to 4 weeks ago. The problem has been gradually improving since onset. The problem is controlled. Pertinent negatives include no blurred vision, chest pain or shortness of breath. Past treatments include calcium channel blockers. The current treatment provides moderate improvement.  Hyperlipidemia This is a chronic problem. The current episode started more than 1 year ago. The problem is uncontrolled. Recent lipid tests were reviewed and are high. Pertinent negatives include no chest pain, focal weakness, myalgias or shortness of breath. She is currently on no antihyperlipidemic treatment.     Past Medical History:  Diagnosis Date   Allergic rhinitis    Essential hypertension, benign    High cholesterol      Family History  Problem Relation Age of Onset   Breast cancer Sister    Breast cancer Maternal Grandmother    Alzheimer's disease Mother    Bone cancer Father    Throat cancer Brother    Breast cancer Sister    Colon cancer Sister      Current Outpatient Medications:    albuterol (VENTOLIN HFA) 108 (90 Base) MCG/ACT inhaler, Inhale into the lungs every 6 (six) hours as needed for  wheezing or shortness of breath., Disp: , Rfl:    meloxicam (MOBIC) 7.5 MG tablet, TAKE 1 TABLET TWICE A DAY AS NEEDED FOR SHOULDER PAIN, Disp: 60 tablet, Rfl: 0   amLODipine (NORVASC) 2.5 MG tablet, Take 1 tablet (2.5 mg total) by mouth daily. Pt needs appt for further refills, Disp: 90 tablet, Rfl: 2   atorvastatin (LIPITOR) 80 MG tablet, Take 1 tablet (80 mg total) by mouth daily., Disp: 90 tablet, Rfl: 1   Allergies  Allergen Reactions   Acetaminophen Other (See Comments)    Hives/swelling   Fomepizole Other (See Comments)    Hives/swelling     Review of Systems  Constitutional: Negative.   Eyes:  Negative for blurred vision.  Respiratory: Negative.  Negative for shortness of breath.   Cardiovascular: Negative.  Negative for chest pain.  Gastrointestinal: Negative.   Musculoskeletal:  Negative for myalgias.  Neurological: Negative.  Negative for focal weakness.  Psychiatric/Behavioral: Negative.       Today's Vitals   10/17/22 1450  BP: 124/84  Pulse: (!) 52  Temp: 98.3 F (36.8 C)  SpO2: 98%  Weight: 186 lb (84.4 kg)  Height: 5\' 3"  (1.6 m)   Body mass index is 32.95 kg/m.  Wt Readings from Last 3 Encounters:  10/17/22 186 lb (84.4 kg)  09/04/22 183 lb 9.6 oz (83.3 kg)  11/22/21 182 lb 3.2 oz (82.6 kg)    Objective:  Physical Exam Vitals and  nursing note reviewed.  Constitutional:      Appearance: Normal appearance.  HENT:     Head: Normocephalic and atraumatic.     Nose:     Comments: Masked     Mouth/Throat:     Comments: Masked  Eyes:     Extraocular Movements: Extraocular movements intact.  Cardiovascular:     Rate and Rhythm: Normal rate and regular rhythm.     Heart sounds: Normal heart sounds.  Pulmonary:     Effort: Pulmonary effort is normal.     Breath sounds: Normal breath sounds.  Musculoskeletal:     Cervical back: Normal range of motion.  Skin:    General: Skin is warm.  Neurological:     General: No focal deficit present.     Mental  Status: She is alert.  Psychiatric:        Mood and Affect: Mood normal.        Behavior: Behavior normal.         Assessment And Plan:     1. Essential hypertension, benign Comments: Chronic, well controlled.  She will c/w amlodipine 2.5mg  daily. She is encouraged to follow a low sodium diet and c/w regular exercise regimen. F/u 6 months. - CMP14+EGFR - CBC - Lipid panel - Vitamin D (25 hydroxy)  2. Pure hypercholesterolemia Comments: Chronic, she is currently on atorvastatin 80mg  daily. I will check non-fasting lipid panel and LFTs today. - CMP14+EGFR - CBC - Lipid panel - Vitamin D (25 hydroxy)  3. Other abnormal glucose Comments: Her a1c has been elevated in the past. I will recheck an a1c today. Encouraged to follow diet low in refined carbs. - Hemoglobin A1c  4. Vitamin D deficiency disease Comments: I will check vitamin D level and supplement as needed. - Vitamin D (25 hydroxy)  5. Class 1 obesity due to excess calories without serious comorbidity with body mass index (BMI) of 32.0 to 32.9 in adult Comments: She is encouraged to aim for at least 150 minutes of exercise/week, while striving for BMI<30 to decrease cardiac risk.  Patient was given opportunity to ask questions. Patient verbalized understanding of the plan and was able to repeat key elements of the plan. All questions were answered to their satisfaction.   I, Ruth Greenland, MD, have reviewed all documentation for this visit. The documentation on 10/17/22 for the exam, diagnosis, procedures, and orders are all accurate and complete.   IF YOU HAVE BEEN REFERRED TO A SPECIALIST, IT MAY TAKE 1-2 WEEKS TO SCHEDULE/PROCESS THE REFERRAL. IF YOU HAVE NOT HEARD FROM US/SPECIALIST IN TWO WEEKS, PLEASE GIVE [place] A CALL AT [phone] X [phone].   THE PATIENT IS ENCOURAGED TO PRACTICE SOCIAL DISTANCING DUE TO THE COVID-19 PANDEMIC.

## 2022-10-18 LAB — CMP14+EGFR
ALT: 29 IU/L (ref 0–32)
AST: 32 IU/L (ref 0–40)
Albumin/Globulin Ratio: 1.3 (ref 1.2–2.2)
Albumin: 4.3 g/dL (ref 3.9–4.9)
Alkaline Phosphatase: 72 IU/L (ref 44–121)
BUN/Creatinine Ratio: 11 — ABNORMAL LOW (ref 12–28)
BUN: 9 mg/dL (ref 8–27)
Bilirubin Total: 0.6 mg/dL (ref 0.0–1.2)
CO2: 26 mmol/L (ref 20–29)
Calcium: 9.6 mg/dL (ref 8.7–10.3)
Chloride: 103 mmol/L (ref 96–106)
Creatinine, Ser: 0.79 mg/dL (ref 0.57–1.00)
Globulin, Total: 3.4 g/dL (ref 1.5–4.5)
Glucose: 92 mg/dL (ref 70–99)
Potassium: 4.8 mmol/L (ref 3.5–5.2)
Sodium: 143 mmol/L (ref 134–144)
Total Protein: 7.7 g/dL (ref 6.0–8.5)
eGFR: 82 mL/min/1.73

## 2022-10-18 LAB — CBC
Hematocrit: 42.7 % (ref 34.0–46.6)
Hemoglobin: 14.4 g/dL (ref 11.1–15.9)
MCH: 32.3 pg (ref 26.6–33.0)
MCHC: 33.7 g/dL (ref 31.5–35.7)
MCV: 96 fL (ref 79–97)
Platelets: 251 x10E3/uL (ref 150–450)
RBC: 4.46 x10E6/uL (ref 3.77–5.28)
RDW: 12.7 % (ref 11.7–15.4)
WBC: 4.2 x10E3/uL (ref 3.4–10.8)

## 2022-10-18 LAB — LIPID PANEL
Chol/HDL Ratio: 2.3 ratio (ref 0.0–4.4)
Cholesterol, Total: 161 mg/dL (ref 100–199)
HDL: 70 mg/dL (ref >39)
LDL Chol Calc (NIH): 80 mg/dL (ref 0–99)
Triglycerides: 53 mg/dL (ref 0–149)
VLDL Cholesterol Cal: 11 mg/dL (ref 5–40)

## 2022-10-18 LAB — VITAMIN D 25 HYDROXY (VIT D DEFICIENCY, FRACTURES): Vit D, 25-Hydroxy: 46.9 ng/mL (ref 30.0–100.0)

## 2022-10-18 LAB — HEMOGLOBIN A1C
Est. average glucose Bld gHb Est-mCnc: 126 mg/dL
Hgb A1c MFr Bld: 6 % — ABNORMAL HIGH (ref 4.8–5.6)

## 2023-03-11 ENCOUNTER — Encounter: Payer: Self-pay | Admitting: Internal Medicine

## 2023-03-14 ENCOUNTER — Encounter: Payer: Self-pay | Admitting: Internal Medicine

## 2023-03-14 ENCOUNTER — Telehealth: Payer: BC Managed Care – PPO | Admitting: Physician Assistant

## 2023-03-14 DIAGNOSIS — J019 Acute sinusitis, unspecified: Secondary | ICD-10-CM

## 2023-03-14 DIAGNOSIS — B9789 Other viral agents as the cause of diseases classified elsewhere: Secondary | ICD-10-CM | POA: Diagnosis not present

## 2023-03-14 MED ORDER — AZELASTINE HCL 0.1 % NA SOLN: 1.0000 | Freq: Two times a day (BID) | NASAL | 0 refills | Status: AC

## 2023-03-14 NOTE — Progress Notes (Signed)

## 2023-03-14 NOTE — Progress Notes (Signed)
I have spent 5 minutes in review of e-visit questionnaire, review and updating patient chart, medical decision making and response to patient.   Tavari Loadholt Cody Eldrige Pitkin, PA-C    

## 2023-03-26 ENCOUNTER — Encounter: Payer: Self-pay | Admitting: Internal Medicine

## 2023-03-26 ENCOUNTER — Telehealth (INDEPENDENT_AMBULATORY_CARE_PROVIDER_SITE_OTHER): Payer: Medicare Other | Admitting: Internal Medicine

## 2023-03-26 DIAGNOSIS — J0100 Acute maxillary sinusitis, unspecified: Secondary | ICD-10-CM | POA: Diagnosis not present

## 2023-03-26 DIAGNOSIS — U071 COVID-19: Secondary | ICD-10-CM | POA: Insufficient documentation

## 2023-03-26 MED ORDER — AMOXICILLIN-POT CLAVULANATE 875-125 MG PO TABS: 1.0000 | ORAL_TABLET | Freq: Two times a day (BID) | ORAL | 0 refills | Status: DC

## 2023-03-26 NOTE — Assessment & Plan Note (Signed)
She would like treatment;  however, we are outside of the 5 day window. I will refer her for home monitoring/temperature monitoring program. She is encouraged to email me daily on Mychart to let me know how she is doing. She is encouraged to go to ER should she develop worsening SOB. Pt advised that she will be out of work for five days, although she may return sooner if afebrile greater than 24 hours, she should mask for 10 days. She is also advised to stay well hydrated, move periodically throughout the day and to have a hot beverage daily.  She verbalizes understanding of her treatment plan. All questions were answered to her satisfaction. She understands that she needs to continue to self quarantine  She was also advised to consider use of Oscillococcinum to help with her symptoms. She will continue with Mucinex as well.

## 2023-03-26 NOTE — Assessment & Plan Note (Signed)
I will send rx Augmentin, encouraged to take full course of abx. Advised to avoid dairy. Encouraged to drink plenty of liquids and to drink a hot liquid daily.

## 2023-03-26 NOTE — Progress Notes (Signed)
Virtual Visit via Video   This visit type was conducted due to national recommendations for restrictions regarding the COVID-19 Pandemic (e.g. social distancing) in an effort to limit this patient's exposure and mitigate transmission in our community.  Due to her co-morbid illnesses, this patient is at least at moderate risk for complications without adequate follow up.  This format is felt to be most appropriate for this patient at this time.  All issues noted in this document were discussed and addressed.  A limited physical exam was performed with this format.    This visit type was conducted due to national recommendations for restrictions regarding the COVID-19 Pandemic (e.g. social distancing) in an effort to limit this patient's exposure and mitigate transmission in our community.  Patients identity confirmed using two different identifiers.  This format is felt to be most appropriate for this patient at this time.  All issues noted in this document were discussed and addressed.  No physical exam was performed (except for noted visual exam findings with Video Visits).    Date:  03/26/2023   ID:  Ruth Davis, DOB [birth year], MRN [id number]  Patient Location:  Home  Provider location:   Office    Chief Complaint:  "I have COVID"  History of Present Illness:    Ruth Davis is a 67 y.o. female who presents via video conferencing for a telehealth visit today.    The patient does have symptoms concerning for COVID-19 infection (fever, chills, cough, or new shortness of breath).   Patient presents today for further evaluation of POSITIVE COVID test.  She states she recently returned from international trip to [place], she traveled 6/15-6/24.  She admits being around people who did a lot of coughing. Last Thursday, she developed sore throat, fatigue, headaches and a dry cough. She felt like she could not get warm. She did take some Tylenol ES and nasal spray with minimal  relief of her symptoms.  Since her return, she started Mucinex which has helped with her symptoms. She states there are others in her group who are also positive for COVID. Today, she has sinus congestion, fever 99.1 and dry cough.        Past Medical History:  Diagnosis Date   Allergic rhinitis    Essential hypertension, benign    High cholesterol    Past Surgical History:  Procedure Laterality Date   REDUCTION MAMMAPLASTY Bilateral 2008     Current Meds  Medication Sig   albuterol (VENTOLIN HFA) 108 (90 Base) MCG/ACT inhaler Inhale into the lungs every 6 (six) hours as needed for wheezing or shortness of breath.   amLODipine (NORVASC) 2.5 MG tablet Take 1 tablet (2.5 mg total) by mouth daily. Pt needs appt for further refills   amoxicillin-clavulanate (AUGMENTIN) 875-125 MG tablet Take 1 tablet by mouth 2 (two) times daily.   atorvastatin (LIPITOR) 80 MG tablet Take 1 tablet (80 mg total) by mouth daily.   azelastine (ASTELIN) 0.1 % nasal spray Place 1 spray into both nostrils 2 (two) times daily. Use in each nostril as directed   meloxicam (MOBIC) 7.5 MG tablet TAKE 1 TABLET TWICE A DAY AS NEEDED FOR SHOULDER PAIN     Allergies:   Acetaminophen and Fomepizole   Social History   Tobacco Use   Smoking status: Never   Smokeless tobacco: Never  Vaping Use   Vaping Use: Never used  Substance Use Topics   Alcohol use: Never   Drug use: Never  Family Hx: The patient's family history includes Alzheimer's disease in her mother; Bone cancer in her father; Breast cancer in her maternal grandmother, sister, and sister; Colon cancer in her sister; Throat cancer in her brother.  ROS:   Please see the history of present illness.    Review of Systems  Constitutional:  Positive for fever.  HENT:  Positive for congestion, sinus pain and sore throat.   Respiratory:  Positive for cough.   Cardiovascular: Negative.   Musculoskeletal:  Positive for myalgias.  Skin: Negative.      All other systems reviewed and are negative.   Labs/Other Tests and Data Reviewed:    Recent Labs: 10/17/2022: ALT 29; BUN 9; Creatinine, Ser 0.79; Hemoglobin 14.4; Platelets 251; Potassium 4.8; Sodium 143   Recent Lipid Panel Lab Results  Component Value Date/Time   CHOL 161 10/17/2022 03:30 PM   TRIG 53 10/17/2022 03:30 PM   HDL 70 10/17/2022 03:30 PM   CHOLHDL 2.3 10/17/2022 03:30 PM   LDLCALC 80 10/17/2022 03:30 PM    Wt Readings from Last 3 Encounters:  10/17/22 186 lb (84.4 kg)  09/04/22 183 lb 9.6 oz (83.3 kg)  11/22/21 182 lb 3.2 oz (82.6 kg)     Exam:    Vital Signs:  There were no vitals taken for this visit.    Physical Exam Vitals and nursing note reviewed.  Constitutional:      Appearance: She is ill-appearing.  HENT:     Head: Normocephalic and atraumatic.     Comments: She sounds congested.  Eyes:     Extraocular Movements: Extraocular movements intact.  Pulmonary:     Effort: Pulmonary effort is normal.     Comments: She is able to speak in full sentences.  Musculoskeletal:     Cervical back: Normal range of motion.  Neurological:     Mental Status: She is alert and oriented to person, place, and time.  Psychiatric:        Mood and Affect: Affect normal.     ASSESSMENT & PLAN:     COVID Assessment & Plan:  She would like treatment;  however, we are outside of the 5 day window. I will refer her for home monitoring/temperature monitoring program. She is encouraged to email me daily on Mychart to let me know how she is doing. She is encouraged to go to ER should she develop worsening SOB. Pt advised that she will be out of work for five days, although she may return sooner if afebrile greater than 24 hours, she should mask for 10 days. She is also advised to stay well hydrated, move periodically throughout the day and to have a hot beverage daily.  She verbalizes understanding of her treatment plan. All questions were answered to her satisfaction.  She understands that she needs to continue to self quarantine  She was also advised to consider use of Oscillococcinum to help with her symptoms. She will continue with Mucinex as well.   Orders: -     MyChart Temperature FLOWSHEET; Future  Acute non-recurrent maxillary sinusitis Assessment & Plan: I will send rx Augmentin, encouraged to take full course of abx. Advised to avoid dairy. Encouraged to drink plenty of liquids and to drink a hot liquid daily.    Other orders -     [facility] COVID-19 HOME MONITORING PROGRAM; Future -     Amoxicillin-Pot Clavulanate; Take 1 tablet by mouth 2 (two) times daily.  Dispense: 20 tablet; Refill: 0  COVID-19 Education: The signs and symptoms of COVID-19 were discussed with the patient and how to seek care for testing (follow up with PCP or arrange E-visit).  The importance of social distancing was discussed today.  Patient Risk:   After full review of this patients clinical status, I feel that they are at least moderate risk at this time.  Time:   Today, I have spent 12 minutes/ seconds with the patient with telehealth technology discussing above diagnoses.     Medication Adjustments/Labs and Tests Ordered: Current medicines are reviewed at length with the patient today.  Concerns regarding medicines are outlined above.   Tests Ordered: No orders of the defined types were placed in this encounter.   Medication Changes: Meds ordered this encounter  Medications   amoxicillin-clavulanate (AUGMENTIN) 875-125 MG tablet    Sig: Take 1 tablet by mouth 2 (two) times daily.    Dispense:  20 tablet    Refill:  0    Disposition:  Follow up prn  Signed, Gwynneth Aliment, MD

## 2023-03-26 NOTE — Patient Instructions (Signed)

## 2023-04-17 ENCOUNTER — Ambulatory Visit (INDEPENDENT_AMBULATORY_CARE_PROVIDER_SITE_OTHER): Payer: Medicare Other | Admitting: Internal Medicine

## 2023-04-17 ENCOUNTER — Encounter: Payer: Self-pay | Admitting: Internal Medicine

## 2023-04-17 VITALS — BP 122/80 | HR 72 | Temp 98.0°F | Ht 63.0 in | Wt 181.8 lb

## 2023-04-17 DIAGNOSIS — Z6832 Body mass index (BMI) 32.0-32.9, adult: Secondary | ICD-10-CM

## 2023-04-17 DIAGNOSIS — E6609 Other obesity due to excess calories: Secondary | ICD-10-CM

## 2023-04-17 DIAGNOSIS — R7309 Other abnormal glucose: Secondary | ICD-10-CM

## 2023-04-17 DIAGNOSIS — E7800 Pure hypercholesterolemia, unspecified: Secondary | ICD-10-CM | POA: Diagnosis not present

## 2023-04-17 DIAGNOSIS — I10 Essential (primary) hypertension: Secondary | ICD-10-CM | POA: Diagnosis not present

## 2023-04-17 LAB — CMP14+EGFR
ALT: 24 IU/L (ref 0–32)
AST: 28 IU/L (ref 0–40)
Albumin: 4.1 g/dL (ref 3.9–4.9)
Alkaline Phosphatase: 65 IU/L (ref 44–121)
BUN/Creatinine Ratio: 15 (ref 12–28)
BUN: 13 mg/dL (ref 8–27)
Bilirubin Total: 0.5 mg/dL (ref 0.0–1.2)
CO2: 27 mmol/L (ref 20–29)
Calcium: 9.2 mg/dL (ref 8.7–10.3)
Chloride: 103 mmol/L (ref 96–106)
Creatinine, Ser: 0.86 mg/dL (ref 0.57–1.00)
Globulin, Total: 3.3 g/dL (ref 1.5–4.5)
Glucose: 87 mg/dL (ref 70–99)
Potassium: 4.2 mmol/L (ref 3.5–5.2)
Sodium: 142 mmol/L (ref 134–144)
Total Protein: 7.4 g/dL (ref 6.0–8.5)
eGFR: 74 mL/min/{1.73_m2} (ref >59)

## 2023-04-17 LAB — LIPID PANEL
Chol/HDL Ratio: 2.4 ratio (ref 0.0–4.4)
Cholesterol, Total: 158 mg/dL (ref 100–199)
HDL: 67 mg/dL (ref >39)
LDL Chol Calc (NIH): 80 mg/dL (ref 0–99)
Triglycerides: 50 mg/dL (ref 0–149)
VLDL Cholesterol Cal: 11 mg/dL (ref 5–40)

## 2023-04-17 LAB — HEMOGLOBIN A1C
Est. average glucose Bld gHb Est-mCnc: 126 mg/dL
Hgb A1c MFr Bld: 6 % — ABNORMAL HIGH (ref 4.8–5.6)

## 2023-04-17 MED ORDER — ATORVASTATIN CALCIUM 80 MG PO TABS: ORAL_TABLET | ORAL | 1 refills | Status: DC

## 2023-04-17 MED ORDER — AMLODIPINE BESYLATE 2.5 MG PO TABS: 2.5000 mg | ORAL_TABLET | Freq: Every day | ORAL | 2 refills | Status: DC

## 2023-04-17 NOTE — Assessment & Plan Note (Signed)
Her a1c has been elevated in the past. I will recheck an a1c today. Encouraged to follow diet low in refined carbs.

## 2023-04-17 NOTE — Patient Instructions (Signed)
Cholesterol Content in Foods Cholesterol is a waxy, fat-like substance that helps to carry fat in the blood. The body needs cholesterol in small amounts, but too much cholesterol can cause damage to the arteries and heart. What foods have cholesterol?  Cholesterol is found in animal-based foods, such as meat, seafood, and dairy. Generally, low-fat dairy and lean meats have less cholesterol than full-fat dairy and fatty meats. The milligrams of cholesterol per serving (mg per serving) of common cholesterol-containing foods are listed below. Meats and other proteins Egg -- one large whole egg has 186 mg. Veal shank -- 4 oz (113 g) has 141 mg. Lean ground turkey (93% lean) -- 4 oz (113 g) has 118 mg. Fat-trimmed lamb loin -- 4 oz (113 g) has 106 mg. Lean ground beef (90% lean) -- 4 oz (113 g) has 100 mg. Lobster -- 3.5 oz (99 g) has 90 mg. Pork loin chops -- 4 oz (113 g) has 86 mg. Canned salmon -- 3.5 oz (99 g) has 83 mg. Fat-trimmed beef top loin -- 4 oz (113 g) has 78 mg. Frankfurter -- 1 frank (3.5 oz or 99 g) has 77 mg. Crab -- 3.5 oz (99 g) has 71 mg. Roasted chicken without skin, white meat -- 4 oz (113 g) has 66 mg. Light bologna -- 2 oz (57 g) has 45 mg. Deli-cut turkey -- 2 oz (57 g) has 31 mg. Canned tuna -- 3.5 oz (99 g) has 31 mg. Bacon -- 1 oz (28 g) has 29 mg. Oysters and mussels (raw) -- 3.5 oz (99 g) has 25 mg. Mackerel -- 1 oz (28 g) has 22 mg. Trout -- 1 oz (28 g) has 20 mg. Pork sausage -- 1 link (1 oz or 28 g) has 17 mg. Salmon -- 1 oz (28 g) has 16 mg. Tilapia -- 1 oz (28 g) has 14 mg. Dairy Soft-serve ice cream --  cup (4 oz or 86 g) has 103 mg. Whole-milk yogurt -- 1 cup (8 oz or 245 g) has 29 mg. Cheddar cheese -- 1 oz (28 g) has 28 mg. American cheese -- 1 oz (28 g) has 28 mg. Whole milk -- 1 cup (8 oz or 250 mL) has 23 mg. 2% milk -- 1 cup (8 oz or 250 mL) has 18 mg. Cream cheese -- 1 tablespoon (Tbsp) (14.5 g) has 15 mg. Cottage cheese --  cup (4 oz or  113 g) has 14 mg. Low-fat (1%) milk -- 1 cup (8 oz or 250 mL) has 10 mg. Sour cream -- 1 Tbsp (12 g) has 8.5 mg. Low-fat yogurt -- 1 cup (8 oz or 245 g) has 8 mg. Nonfat Greek yogurt -- 1 cup (8 oz or 228 g) has 7 mg. Half-and-half cream -- 1 Tbsp (15 mL) has 5 mg. Fats and oils Cod liver oil -- 1 tablespoon (Tbsp) (13.6 g) has 82 mg. Butter -- 1 Tbsp (14 g) has 15 mg. Lard -- 1 Tbsp (12.8 g) has 14 mg. Bacon grease -- 1 Tbsp (12.9 g) has 14 mg. Mayonnaise -- 1 Tbsp (13.8 g) has 5-10 mg. Margarine -- 1 Tbsp (14 g) has 3-10 mg. The items listed above may not be a complete list of foods with cholesterol. Exact amounts of cholesterol in these foods may vary depending on specific ingredients and brands. Contact a dietitian for more information. What foods do not have cholesterol? Most plant-based foods do not have cholesterol unless you combine them with a food that has   cholesterol. Foods without cholesterol include: Grains and cereals. Vegetables. Fruits. Vegetable oils, such as olive, canola, and sunflower oil. Legumes, such as peas, beans, and lentils. Nuts and seeds. Egg whites. The items listed above may not be a complete list of foods that do not have cholesterol. Contact a dietitian for more information. Summary The body needs cholesterol in small amounts, but too much cholesterol can cause damage to the arteries and heart. Cholesterol is found in animal-based foods, such as meat, seafood, and dairy. Generally, low-fat dairy and lean meats have less cholesterol than full-fat dairy and fatty meats. This information is not intended to replace advice given to you by your health care provider. Make sure you discuss any questions you have with your health care provider. Document Revised: 01/26/2021 Document Reviewed: 01/26/2021 Elsevier Patient Education  2024 Elsevier Inc.  

## 2023-04-17 NOTE — Progress Notes (Signed)
I,Jameka J Llittleton, CMA,acting as a Neurosurgeon for Gwynneth Aliment, MD.,have documented all relevant documentation on the behalf of Gwynneth Aliment, MD,as directed by  Gwynneth Aliment, MD while in the presence of Gwynneth Aliment, MD.  Subjective:  Patient ID: Ruth Davis , female    DOB: 12-11-1955 , 67 y.o.   MRN: [id number]  Chief Complaint  Patient presents with   Hyperlipidemia   Hypertension    HPI  She presents today for f/u hypertension and cholesterol.  She reports compliance with meds. She denies headaches, chest pain and shortness of breath. She is exercising on a regular basis. She reports no specific questions or concerns.   Hypertension This is a new problem. The current episode started 1 to 4 weeks ago. The problem has been gradually improving since onset. The problem is controlled. Pertinent negatives include no blurred vision, chest pain or shortness of breath. Past treatments include calcium channel blockers. The current treatment provides moderate improvement.  Hyperlipidemia This is a chronic problem. The current episode started more than 1 year ago. The problem is uncontrolled. Recent lipid tests were reviewed and are high. Pertinent negatives include no chest pain, focal weakness, myalgias or shortness of breath. She is currently on no antihyperlipidemic treatment.     Past Medical History:  Diagnosis Date   Allergic rhinitis    Essential hypertension, benign    High cholesterol      Family History  Problem Relation Age of Onset   Breast cancer Sister    Breast cancer Maternal Grandmother    Alzheimer's disease Mother    Bone cancer Father    Throat cancer Brother    Breast cancer Sister    Colon cancer Sister      Current Outpatient Medications:    albuterol (VENTOLIN HFA) 108 (90 Base) MCG/ACT inhaler, Inhale into the lungs every 6 (six) hours as needed for wheezing or shortness of breath., Disp: , Rfl:    azelastine (ASTELIN) 0.1 % nasal spray,  Place 1 spray into both nostrils 2 (two) times daily. Use in each nostril as directed, Disp: 30 mL, Rfl: 0   meloxicam (MOBIC) 7.5 MG tablet, TAKE 1 TABLET TWICE A DAY AS NEEDED FOR SHOULDER PAIN, Disp: 60 tablet, Rfl: 0   amLODipine (NORVASC) 2.5 MG tablet, Take 1 tablet (2.5 mg total) by mouth daily. Pt needs appt for further refills, Disp: 90 tablet, Rfl: 2   amoxicillin-clavulanate (AUGMENTIN) 875-125 MG tablet, Take 1 tablet by mouth 2 (two) times daily. (Patient not taking: Reported on 04/17/2023), Disp: 20 tablet, Rfl: 0   atorvastatin (LIPITOR) 80 MG tablet, Take one tab po daily M-F, skip the weekends, Disp: 75 tablet, Rfl: 1   Allergies  Allergen Reactions   Acetaminophen Other (See Comments)    Hives/swelling   Fomepizole Other (See Comments)    Hives/swelling     Review of Systems  Constitutional: Negative.   Eyes:  Negative for blurred vision.  Respiratory: Negative.  Negative for shortness of breath.   Cardiovascular: Negative.  Negative for chest pain.  Gastrointestinal: Negative.   Musculoskeletal:  Negative for myalgias.  Skin: Negative.   Neurological: Negative.  Negative for focal weakness.  Psychiatric/Behavioral: Negative.       Today's Vitals   04/17/23 0827  BP: 122/80  Pulse: 72  Temp: 98 F (36.7 C)  SpO2: 98%  Weight: 181 lb 12.8 oz (82.5 kg)  Height: 5\' 3"  (1.6 m)  PainSc: 0-No pain   Body mass  index is 32.2 kg/m.  Wt Readings from Last 3 Encounters:  04/17/23 181 lb 12.8 oz (82.5 kg)  10/17/22 186 lb (84.4 kg)  09/04/22 183 lb 9.6 oz (83.3 kg)    The 10-year ASCVD risk score (Arnett DK, et al., 2019) is: 6.9%   Values used to calculate the score:     Age: 2 years     Sex: Female     Is Non-Hispanic African American: Yes     Diabetic: No     Tobacco smoker: No     Systolic Blood Pressure: 122 mmHg     Is BP treated: Yes     HDL Cholesterol: 70 mg/dL     Total Cholesterol: 161 mg/dL  Objective:  Physical Exam Vitals and nursing note  reviewed.  Constitutional:      Appearance: Normal appearance.  HENT:     Head: Normocephalic and atraumatic.  Eyes:     Extraocular Movements: Extraocular movements intact.  Cardiovascular:     Rate and Rhythm: Normal rate and regular rhythm.     Heart sounds: Normal heart sounds.  Pulmonary:     Effort: Pulmonary effort is normal.     Breath sounds: Normal breath sounds.  Skin:    General: Skin is warm.  Neurological:     General: No focal deficit present.     Mental Status: She is alert.  Psychiatric:        Mood and Affect: Mood normal.        Behavior: Behavior normal.         Assessment And Plan:  Pure hypercholesterolemia Assessment & Plan: Chronic, she will c/w atorvastatin 80mg  M-F. She is encouraged to follow a heart healthy diet. She will rto in Dec 2024 for her AWV.   Orders: -     Lipid panel -     CMP14+EGFR  Essential hypertension, benign Assessment & Plan: Chronic, well controlled. She will c/w amlodipine 2.5mg  daily. She is encouraged to follow a low sodium diet and c/w regular exercise regimen. F/u 6 months.   Orders: -     Lipid panel -     CMP14+EGFR  Other abnormal glucose Assessment & Plan: Her a1c has been elevated in the past. I will recheck an a1c today. Encouraged to follow diet low in refined carbs.   Orders: -     Hemoglobin A1c -     CMP14+EGFR  Class 1 obesity due to excess calories without serious comorbidity with body mass index (BMI) of 32.0 to 32.9 in adult Assessment & Plan: She is encouraged to strive for BMI less than 30 to decrease cardiac risk. Advised to aim for at least 150 minutes of exercise per week.    Other orders -     amLODIPine Besylate; Take 1 tablet (2.5 mg total) by mouth daily. Pt needs appt for further refills  Dispense: 90 tablet; Refill: 2 -     Atorvastatin Calcium; Take one tab po daily M-F, skip the weekends  Dispense: 75 tablet; Refill: 1  She is encouraged to strive for BMI less than 30 to decrease  cardiac risk. Advised to aim for at least 150 minutes of exercise per week.   Return if symptoms worsen or fail to improve.  Patient was given opportunity to ask questions. Patient verbalized understanding of the plan and was able to repeat key elements of the plan. All questions were answered to their satisfaction.    I, Gwynneth Aliment, MD, have reviewed all  documentation for this visit. The documentation on 04/17/23 for the exam, diagnosis, procedures, and orders are all accurate and complete.   IF YOU HAVE BEEN REFERRED TO A SPECIALIST, IT MAY TAKE 1-2 WEEKS TO SCHEDULE/PROCESS THE REFERRAL. IF YOU HAVE NOT HEARD FROM US/SPECIALIST IN TWO WEEKS, PLEASE GIVE [place] A CALL AT [phone].

## 2023-04-17 NOTE — Assessment & Plan Note (Signed)
Chronic, she will c/w atorvastatin 80mg  M-F. She is encouraged to follow a heart healthy diet. She will rto in Dec 2024 for her AWV.

## 2023-04-17 NOTE — Assessment & Plan Note (Signed)
Chronic, well controlled. She will c/w amlodipine 2.5mg  daily. She is encouraged to follow a low sodium diet and c/w regular exercise regimen. F/u 6 months.

## 2023-04-17 NOTE — Assessment & Plan Note (Signed)
She is encouraged to strive for BMI less than 30 to decrease cardiac risk. Advised to aim for at least 150 minutes of exercise per week.  

## 2023-06-06 ENCOUNTER — Ambulatory Visit (INDEPENDENT_AMBULATORY_CARE_PROVIDER_SITE_OTHER): Payer: Medicare Other

## 2023-06-06 VITALS — BP 120/80 | HR 85 | Temp 98.5°F | Ht 63.0 in | Wt 181.0 lb

## 2023-06-06 DIAGNOSIS — Z23 Encounter for immunization: Secondary | ICD-10-CM | POA: Diagnosis not present

## 2023-06-06 NOTE — Progress Notes (Signed)
Patient presents today for a flu vaccine. Patient reports she is getting her new covid shot tomorrow, Patient was encouraged to wait at least 30 days after flu shot. Patient stated she always gets them at the same time.  BP Readings from Last 3 Encounters:  06/06/23 120/80  04/17/23 122/80  10/17/22 124/84

## 2023-09-17 ENCOUNTER — Ambulatory Visit (INDEPENDENT_AMBULATORY_CARE_PROVIDER_SITE_OTHER): Payer: Medicare Other | Admitting: Internal Medicine

## 2023-09-17 ENCOUNTER — Encounter: Payer: Self-pay | Admitting: Internal Medicine

## 2023-09-17 ENCOUNTER — Ambulatory Visit: Payer: Medicare Other

## 2023-09-17 VITALS — BP 122/72 | HR 66 | Temp 98.5°F | Ht 63.0 in | Wt 186.0 lb

## 2023-09-17 DIAGNOSIS — E7800 Pure hypercholesterolemia, unspecified: Secondary | ICD-10-CM

## 2023-09-17 DIAGNOSIS — R7309 Other abnormal glucose: Secondary | ICD-10-CM | POA: Diagnosis not present

## 2023-09-17 DIAGNOSIS — Z0000 Encounter for general adult medical examination without abnormal findings: Secondary | ICD-10-CM

## 2023-09-17 DIAGNOSIS — I10 Essential (primary) hypertension: Secondary | ICD-10-CM | POA: Diagnosis not present

## 2023-09-17 DIAGNOSIS — M791 Myalgia, unspecified site: Secondary | ICD-10-CM

## 2023-09-17 NOTE — Patient Instructions (Signed)
Hypertension, Adult Hypertension is another name for high blood pressure. High blood pressure forces your heart to work harder to pump blood. This can cause problems over time. There are two numbers in a blood pressure reading. There is a top number (systolic) over a bottom number (diastolic). It is best to have a blood pressure that is below 120/80. What are the causes? The cause of this condition is not known. Some other conditions can lead to high blood pressure. What increases the risk? Some lifestyle factors can make you more likely to develop high blood pressure: Smoking. Not getting enough exercise or physical activity. Being overweight. Having too much fat, sugar, calories, or salt (sodium) in your diet. Drinking too much alcohol. Other risk factors include: Having any of these conditions: Heart disease. Diabetes. High cholesterol. Kidney disease. Obstructive sleep apnea. Having a family history of high blood pressure and high cholesterol. Age. The risk increases with age. Stress. What are the signs or symptoms? High blood pressure may not cause symptoms. Very high blood pressure (hypertensive crisis) may cause: Headache. Fast or uneven heartbeats (palpitations). Shortness of breath. Nosebleed. Vomiting or feeling like you may vomit (nauseous). Changes in how you see. Very bad chest pain. Feeling dizzy. Seizures. How is this treated? This condition is treated by making healthy lifestyle changes, such as: Eating healthy foods. Exercising more. Drinking less alcohol. Your doctor may prescribe medicine if lifestyle changes do not help enough and if: Your top number is above 130. Your bottom number is above 80. Your personal target blood pressure may vary. Follow these instructions at home: Eating and drinking  If told, follow the DASH eating plan. To follow this plan: Fill one half of your plate at each meal with fruits and vegetables. Fill one fourth of your plate  at each meal with whole grains. Whole grains include whole-wheat pasta, brown rice, and whole-grain bread. Eat or drink low-fat dairy products, such as skim milk or low-fat yogurt. Fill one fourth of your plate at each meal with low-fat (lean) proteins. Low-fat proteins include fish, chicken without skin, eggs, beans, and tofu. Avoid fatty meat, cured and processed meat, or chicken with skin. Avoid pre-made or processed food. Limit the amount of salt in your diet to less than 1,500 mg each day. Do not drink alcohol if: Your doctor tells you not to drink. You are pregnant, may be pregnant, or are planning to become pregnant. If you drink alcohol: Limit how much you have to: 0-1 drink a day for women. 0-2 drinks a day for men. Know how much alcohol is in your drink. In the [place] one drink equals one 12 oz bottle of beer (355 mL), one 5 oz glass of wine (148 mL), or one 1 oz glass of hard liquor (44 mL). Lifestyle  Work with your doctor to stay at a healthy weight or to lose weight. Ask your doctor what the best weight is for you. Get at least 30 minutes of exercise that causes your heart to beat faster (aerobic exercise) most days of the week. This may include walking, swimming, or biking. Get at least 30 minutes of exercise that strengthens your muscles (resistance exercise) at least 3 days a week. This may include lifting weights or doing Pilates. Do not smoke or use any products that contain nicotine or tobacco. If you need help quitting, ask your doctor. Check your blood pressure at home as told by your doctor. Keep all follow-up visits. Medicines Take over-the-counter and prescription medicines   only as told by your doctor. Follow directions carefully. Do not skip doses of blood pressure medicine. The medicine does not work as well if you skip doses. Skipping doses also puts you at risk for problems. Ask your doctor about side effects or reactions to medicines that you should watch  for. Contact a doctor if: You think you are having a reaction to the medicine you are taking. You have headaches that keep coming back. You feel dizzy. You have swelling in your ankles. You have trouble with your vision. Get help right away if: You get a very bad headache. You start to feel mixed up (confused). You feel weak or numb. You feel faint. You have very bad pain in your: Chest. Belly (abdomen). You vomit more than once. You have trouble breathing. These symptoms may be an emergency. Get help right away. Call 911. Do not wait to see if the symptoms will go away. Do not drive yourself to the hospital. Summary Hypertension is another name for high blood pressure. High blood pressure forces your heart to work harder to pump blood. For most people, a normal blood pressure is less than 120/80. Making healthy choices can help lower blood pressure. If your blood pressure does not get lower with healthy choices, you may need to take medicine. This information is not intended to replace advice given to you by your health care provider. Make sure you discuss any questions you have with your health care provider. Document Revised: 07/05/2021 Document Reviewed: 07/05/2021 Elsevier Patient Education  2024 Elsevier Inc.  

## 2023-09-17 NOTE — Progress Notes (Signed)
I,Victoria T Deloria Lair, CMA,acting as a Neurosurgeon for Gwynneth Aliment, MD.,have documented all relevant documentation on the behalf of Gwynneth Aliment, MD,as directed by  Gwynneth Aliment, MD while in the presence of Gwynneth Aliment, MD.  Subjective:  Patient ID: Ruth Davis , female    DOB: 1956-02-07 , 67 y.o.   MRN: [id number]  Chief Complaint  Patient presents with   Hypertension   Hyperlipidemia    HPI  She presents today for f/u hypertension and cholesterol. She reports compliance with meds. She denies having any headaches, chest pain and shortness of breath. She is exercising on a regular basis. She denies having any specific questions or concerns.   AWV completed with [facility] Advisor: Nickeah  Hypertension This is a new problem. The current episode started 1 to 4 weeks ago. The problem has been gradually improving since onset. The problem is controlled. Pertinent negatives include no blurred vision, chest pain or shortness of breath. Past treatments include calcium channel blockers. The current treatment provides moderate improvement.  Hyperlipidemia This is a chronic problem. The current episode started more than 1 year ago. The problem is uncontrolled. Recent lipid tests were reviewed and are high. Associated symptoms include myalgias. Pertinent negatives include no chest pain, focal weakness or shortness of breath. She is currently on no antihyperlipidemic treatment.     Past Medical History:  Diagnosis Date   Allergic rhinitis    Allergy    Essential hypertension, benign    High cholesterol      Family History  Problem Relation Age of Onset   Breast cancer Sister    Breast cancer Maternal Grandmother    Alzheimer's disease Mother    Bone cancer Father    Throat cancer Brother    Breast cancer Sister    Colon cancer Sister      Current Outpatient Medications:    albuterol (VENTOLIN HFA) 108 (90 Base) MCG/ACT inhaler, Inhale into the lungs every 6 (six) hours as  needed for wheezing or shortness of breath., Disp: , Rfl:    amLODipine (NORVASC) 2.5 MG tablet, Take 1 tablet (2.5 mg total) by mouth daily. Pt needs appt for further refills, Disp: 90 tablet, Rfl: 2   amoxicillin-clavulanate (AUGMENTIN) 875-125 MG tablet, Take 1 tablet by mouth 2 (two) times daily. (Patient not taking: Reported on 09/17/2023), Disp: 20 tablet, Rfl: 0   atorvastatin (LIPITOR) 80 MG tablet, Take one tab po daily M-F, skip the weekends, Disp: 75 tablet, Rfl: 1   azelastine (ASTELIN) 0.1 % nasal spray, Place 1 spray into both nostrils 2 (two) times daily. Use in each nostril as directed, Disp: 30 mL, Rfl: 0   meloxicam (MOBIC) 7.5 MG tablet, TAKE 1 TABLET TWICE A DAY AS NEEDED FOR SHOULDER PAIN, Disp: 60 tablet, Rfl: 0   Allergies  Allergen Reactions   Acetaminophen Other (See Comments)    Hives/swelling   Fomepizole Other (See Comments)    Hives/swelling     Review of Systems  Constitutional: Negative.   Eyes:  Negative for blurred vision.  Respiratory: Negative.  Negative for shortness of breath.   Cardiovascular: Negative.  Negative for chest pain.  Gastrointestinal: Negative.   Musculoskeletal:  Positive for myalgias.  Neurological: Negative.  Negative for focal weakness.  Psychiatric/Behavioral: Negative.       Today's Vitals   09/17/23 1423  BP: 122/72  Pulse: 66  Temp: 98.5 F (36.9 C)  SpO2: 98%  Weight: 186 lb (84.4 kg)  Height: 5\' 3"  (  1.6 m)   Body mass index is 32.95 kg/m.  Wt Readings from Last 3 Encounters:  09/17/23 186 lb (84.4 kg)  09/17/23 186 lb (84.4 kg)  06/06/23 181 lb (82.1 kg)     Objective:  Physical Exam Vitals and nursing note reviewed.  Constitutional:      Appearance: Normal appearance.  HENT:     Head: Normocephalic and atraumatic.  Eyes:     Extraocular Movements: Extraocular movements intact.  Cardiovascular:     Rate and Rhythm: Normal rate and regular rhythm.     Heart sounds: Normal heart sounds.  Pulmonary:      Effort: Pulmonary effort is normal.     Breath sounds: Normal breath sounds.  Musculoskeletal:     Cervical back: Normal range of motion.  Skin:    General: Skin is warm.  Neurological:     General: No focal deficit present.     Mental Status: She is alert.  Psychiatric:        Mood and Affect: Mood normal.        Behavior: Behavior normal.         Assessment And Plan:  Essential hypertension, benign Assessment & Plan: Chronic, well controlled. She will c/w amlodipine 2.5mg  daily. She is encouraged to follow a low sodium diet and c/w regular exercise regimen. F/u 6 months.   Orders: -     CMP14+EGFR  Pure hypercholesterolemia Assessment & Plan: Chronic, she will c/w atorvastatin 80mg  M-F. She is encouraged to follow a heart healthy diet. She will rto in four to six months for re-evaluation. She is encouraged to follow a heart healthy lifestyle.     Orders: -     CMP14+EGFR -     TSH  Other abnormal glucose Assessment & Plan: Her a1c has been elevated in the past. I will recheck an a1c today. Encouraged to follow diet low in refined carbs.   Orders: -     Hemoglobin A1c  Myalgia Assessment & Plan: She is having intermittent sx. I will check labs as below. She is encouraged to stay well hydrated. She may benefit from Mg supplementation.   Orders: -     CBC -     CK -     TSH     Return for 1 YEAR OV W [facility] & RS, 6 month bp & chol.  Patient was given opportunity to ask questions. Patient verbalized understanding of the plan and was able to repeat key elements of the plan. All questions were answered to their satisfaction.    I, Gwynneth Aliment, MD, have reviewed all documentation for this visit. The documentation on 09/17/23 for the exam, diagnosis, procedures, and orders are all accurate and complete.   IF YOU HAVE BEEN REFERRED TO A SPECIALIST, IT MAY TAKE 1-2 WEEKS TO SCHEDULE/PROCESS THE REFERRAL. IF YOU HAVE NOT HEARD FROM US/SPECIALIST IN TWO WEEKS, PLEASE  GIVE [place] A CALL AT [phone] X [phone].   THE PATIENT IS ENCOURAGED TO PRACTICE SOCIAL DISTANCING DUE TO THE COVID-19 PANDEMIC.

## 2023-09-17 NOTE — Patient Instructions (Signed)
Ruth Davis , Thank you for taking time to come for your Medicare Wellness Visit. I appreciate your ongoing commitment to your health goals. Please review the following plan we discussed and let me know if I can assist you in the future.   Referrals/Orders/Follow-Ups/Clinician Recommendations: none  This is a list of the screening recommended for you and due dates:  Health Maintenance  Topic Date Due   Medicare Annual Wellness Visit  09/16/2024   Mammogram  09/26/2024   Colon Cancer Screening  07/31/2026   DTaP/Tdap/Td vaccine (2 - Td or Tdap) 09/27/2026   Pneumonia Vaccine  Completed   Flu Shot  Completed   DEXA scan (bone density measurement)  Completed   COVID-19 Vaccine  Completed   Hepatitis C Screening  Completed   Zoster (Shingles) Vaccine  Completed   HPV Vaccine  Aged Out    Advanced directives: (Copy Requested) Please bring a copy of your health care power of attorney and living will to the office to be added to your chart at your convenience.  Next Medicare Annual Wellness Visit scheduled for next year: No, office will schedule  Insert Preventive Care attachment Insert FALL PREVENTION attachment if needed

## 2023-09-17 NOTE — Progress Notes (Signed)
Subjective:   Ruth Davis is a 67 y.o. female who presents for Medicare Annual (Subsequent) preventive examination.  Visit Complete: In person  Patient Medicare AWV questionnaire was completed by the patient on 09/16/2023; I have confirmed that all information answered by patient is correct and no changes since this date.  Cardiac Risk Factors include: advanced age (>38men, >107 women);obesity (BMI >30kg/m2)     Objective:    Today's Vitals   09/17/23 1411 09/17/23 1412  BP: 122/72   Pulse: 66   Temp: 98.5 F (36.9 C)   TempSrc: Oral   SpO2: 93%   Weight: 186 lb (84.4 kg)   Height: 5\' 3"  (1.6 m)   PainSc:  5    Body mass index is 32.95 kg/m.     09/17/2023    2:16 PM 09/04/2022   11:04 AM 08/13/2021   12:22 PM  Advanced Directives  Does Patient Have a Medical Advance Directive? Yes Yes Yes  Type of Advance Directive Living will Living will Healthcare Power of [place];Living will  Does patient want to make changes to medical advance directive?   Yes (MAU/Ambulatory/Procedural Areas - Information given)  Copy of Healthcare Power of Attorney in Chart?   No - copy requested  Would patient like information on creating a medical advance directive?   No - Patient declined    Current Medications (verified) Outpatient Encounter Medications as of 09/17/2023  Medication Sig   albuterol (VENTOLIN HFA) 108 (90 Base) MCG/ACT inhaler Inhale into the lungs every 6 (six) hours as needed for wheezing or shortness of breath.   amLODipine (NORVASC) 2.5 MG tablet Take 1 tablet (2.5 mg total) by mouth daily. Pt needs appt for further refills   atorvastatin (LIPITOR) 80 MG tablet Take one tab po daily M-F, skip the weekends   azelastine (ASTELIN) 0.1 % nasal spray Place 1 spray into both nostrils 2 (two) times daily. Use in each nostril as directed   meloxicam (MOBIC) 7.5 MG tablet TAKE 1 TABLET TWICE A DAY AS NEEDED FOR SHOULDER PAIN   amoxicillin-clavulanate (AUGMENTIN) 875-125  MG tablet Take 1 tablet by mouth 2 (two) times daily. (Patient not taking: Reported on 09/17/2023)   No facility-administered encounter medications on file as of 09/17/2023.    Allergies (verified) Acetaminophen and Fomepizole   History: Past Medical History:  Diagnosis Date   Allergic rhinitis    Allergy    Essential hypertension, benign    High cholesterol    Past Surgical History:  Procedure Laterality Date   REDUCTION MAMMAPLASTY Bilateral 2008   Family History  Problem Relation Age of Onset   Breast cancer Sister    Breast cancer Maternal Grandmother    Alzheimer's disease Mother    Bone cancer Father    Throat cancer Brother    Breast cancer Sister    Colon cancer Sister    Social History   Socioeconomic History   Marital status: Married    Spouse name: Not on file   Number of children: Not on file   Years of education: Not on file   Highest education level: Not on file  Occupational History   Not on file  Tobacco Use   Smoking status: Never   Smokeless tobacco: Never  Vaping Use   Vaping status: Never Used  Substance and Sexual Activity   Alcohol use: Never   Drug use: Never   Sexual activity: Not on file  Other Topics Concern   Not on file  Social History Narrative  Not on file   Social Drivers of Health   Financial Resource Strain: Low Risk  (09/16/2023)   Overall Financial Resource Strain (CARDIA)    Difficulty of Paying Living Expenses: Not hard at all  Food Insecurity: No Food Insecurity (09/16/2023)   Hunger Vital Sign    Worried About Running Out of Food in the Last Year: Never true    Ran Out of Food in the Last Year: Never true  Transportation Needs: No Transportation Needs (09/16/2023)   PRAPARE - Administrator, Civil Service (Medical): No    Lack of Transportation (Non-Medical): No  Physical Activity: Sufficiently Active (09/16/2023)   Exercise Vital Sign    Days of Exercise per Week: 7 days    Minutes of Exercise  per Session: 40 min  Stress: No Stress Concern Present (09/16/2023)   [employer] of Occupational Health - Occupational Stress Questionnaire    Feeling of Stress : Only a little  Social Connections: Unknown (09/16/2023)   Social Connection and Isolation Panel [NHANES]    Frequency of Communication with Friends and Family: More than three times a week    Frequency of Social Gatherings with Friends and Family: Once a week    Attends Religious Services: Not on Marketing executive or Organizations: Yes    Attends Engineer, structural: More than 4 times per year    Marital Status: Married    Tobacco Counseling Counseling given: Not Answered   Clinical Intake:  Pre-visit preparation completed: Yes  Pain : 0-10 Pain Score: 5  Pain Type: Acute pain Pain Location: Arm Pain Orientation: Right, Left Pain Descriptors / Indicators: Aching Pain Onset: 1 to 4 weeks ago Pain Frequency: Constant     Nutritional Status: BMI > 30  Obese Nutritional Risks: None Diabetes: No  How often do you need to have someone help you when you read instructions, pamphlets, or other written materials from your doctor or pharmacy?: 1 - Never  Interpreter Needed?: No  Information entered by :: NAllen LPN   Activities of Daily Living    09/16/2023    9:03 PM  In your present state of health, do you have any difficulty performing the following activities:  Hearing? 0  Vision? 0  Difficulty concentrating or making decisions? 0  Walking or climbing stairs? 0  Dressing or bathing? 0  Doing errands, shopping? 0  Preparing Food and eating ? N  Using the Toilet? N  In the past six months, have you accidently leaked urine? N  Do you have problems with loss of bowel control? N  Managing your Medications? N  Managing your Finances? N  Housekeeping or managing your Housekeeping? N    Patient Care Team: Dorothyann Peng, MD as PCP - General (Internal Medicine)  Indicate any  recent Medical Services you may have received from other than [facility] providers in the past year (date may be approximate).     Assessment:   This is a routine wellness examination for [place].  Hearing/Vision screen Hearing Screening - Comments:: No hearing issues Vision Screening - Comments:: Regular eye exams, [facility]   Goals Addressed             This Visit's Progress    Patient Stated       09/17/2023, wants to lose 15 pounds       Depression Screen    09/17/2023    2:18 PM 04/17/2023    8:26 AM 10/17/2022  2:49 PM 09/04/2022   11:04 AM 08/13/2021   11:40 AM 07/05/2020    9:48 AM 06/29/2019    9:10 AM  PHQ 2/9 Scores  PHQ - 2 Score 0 0 0 0 0 0 0  PHQ- 9 Score  0         Fall Risk    09/16/2023    9:03 PM 04/17/2023    8:26 AM 10/17/2022    2:48 PM 09/04/2022   11:04 AM 08/13/2021   11:40 AM  Fall Risk   Falls in the past year? 1 0 0 0 0  Comment fell off ladder      Number falls in past yr: 0 0 0 0 0  Injury with Fall? 0 0 0 0 0  Risk for fall due to : Medication side effect No Fall Risks No Fall Risks Medication side effect   Follow up Falls prevention discussed;Falls evaluation completed Falls evaluation completed Falls evaluation completed Falls prevention discussed;Education provided;Falls evaluation completed     MEDICARE RISK AT HOME: Medicare Risk at Home Any stairs in or around the home?: Yes If so, are there any without handrails?: No Home free of loose throw rugs in walkways, pet beds, electrical cords, etc?: Yes Adequate lighting in your home to reduce risk of falls?: Yes Life alert?: No Use of a cane, walker or w/c?: No Grab bars in the bathroom?: No Shower chair or bench in shower?: No Elevated toilet seat or a handicapped toilet?: No  TIMED UP AND GO:  Was the test performed?  Yes  Length of time to ambulate 10 feet: 5 sec Gait steady and fast without use of assistive device    Cognitive Function:        09/17/2023    2:18  PM 09/04/2022   11:05 AM 08/13/2021   11:41 AM  6CIT Screen  What Year? 0 points 0 points 0 points  What month? 0 points 0 points 0 points  What time? 0 points 0 points 0 points  Count back from 20 0 points 0 points 0 points  Months in reverse 0 points 0 points 0 points  Repeat phrase 0 points 0 points 0 points  Total Score 0 points 0 points 0 points    Immunizations Immunization History  Administered Date(s) Administered   Fluad Quad(high Dose 65+) 08/13/2021, 07/15/2022   Fluad Trivalent(High Dose 65+) 06/06/2023   Influenza,inj,Quad PF,6+ Mos 06/29/2019, 07/05/2020   Moderna Covid-19 Fall Seasonal Vaccine 60yrs & older 06/07/2023   Moderna Sars-Covid-2 Vaccination 12/09/2019, 01/11/2020   PFIZER(Purple Top)SARS-COV-2 Vaccination 07/31/2020, 03/02/2021   PNEUMOCOCCAL CONJUGATE-20 08/14/2021   Pfizer Covid-19 Vaccine Bivalent Booster 49yrs & up 08/14/2021   Pfizer(Comirnaty)Fall Seasonal Vaccine 12 years and older 07/15/2022   Tdap 09/27/2016   Zoster Recombinant(Shingrix) 09/03/2020, 11/23/2020    TDAP status: Up to date  Flu Vaccine status: Up to date  Pneumococcal vaccine status: Up to date  Covid-19 vaccine status: Completed vaccines  Qualifies for Shingles Vaccine? Yes   Zostavax completed Yes   Shingrix Completed?: Yes  Screening Tests Health Maintenance  Topic Date Due   Medicare Annual Wellness (AWV)  09/16/2024   MAMMOGRAM  09/26/2024   Colonoscopy  07/31/2026   DTaP/Tdap/Td (2 - Td or Tdap) 09/27/2026   Pneumonia Vaccine 43+ Years old  Completed   INFLUENZA VACCINE  Completed   DEXA SCAN  Completed   COVID-19 Vaccine  Completed   Hepatitis C Screening  Completed   Zoster Vaccines- Shingrix  Completed  HPV VACCINES  Aged Out    Health Maintenance  There are no preventive care reminders to display for this patient.   Colorectal cancer screening: Type of screening: Colonoscopy. Completed 07/31/2016. Repeat every 7 years  Mammogram status:  Completed 09/26/2022. Repeat every year  Bone Density status: Completed 02/07/2022.   Lung Cancer Screening: (Low Dose CT Chest recommended if Age 32-80 years, 20 pack-year currently smoking OR have quit w/in 15years.) does not qualify.   Lung Cancer Screening Referral: no  Additional Screening:  Hepatitis C Screening: does qualify; Completed 11/08/2014  Vision Screening: Recommended annual ophthalmology exams for early detection of glaucoma and other disorders of the eye. Is the patient up to date with their annual eye exam?  Yes  Who is the provider or what is the name of the office in which the patient attends annual eye exams? [facility] If pt is not established with a provider, would they like to be referred to a provider to establish care? No .   Dental Screening: Recommended annual dental exams for proper oral hygiene  Diabetic Foot Exam: n/a  Community Resource Referral / Chronic Care Management: CRR required this visit?  No   CCM required this visit?  No     Plan:     I have personally reviewed and noted the following in the patient's chart:   Medical and social history Use of alcohol, tobacco or illicit drugs  Current medications and supplements including opioid prescriptions. Patient is not currently taking opioid prescriptions. Functional ability and status Nutritional status Physical activity Advanced directives List of other physicians Hospitalizations, surgeries, and ER visits in previous 12 months Vitals Screenings to include cognitive, depression, and falls Referrals and appointments  In addition, I have reviewed and discussed with patient certain preventive protocols, quality metrics, and best practice recommendations. A written personalized care plan for preventive services as well as general preventive health recommendations were provided to patient.     Barb Merino, LPN   [id number]   After Visit Summary: (In Person-Printed) AVS printed and  given to the patient  Nurse Notes: none

## 2023-09-18 LAB — CMP14+EGFR
ALT: 26 [IU]/L (ref 0–32)
AST: 29 [IU]/L (ref 0–40)
Albumin: 3.9 g/dL (ref 3.9–4.9)
Alkaline Phosphatase: 65 [IU]/L (ref 44–121)
BUN/Creatinine Ratio: 16 (ref 12–28)
BUN: 16 mg/dL (ref 8–27)
Bilirubin Total: 0.4 mg/dL (ref 0.0–1.2)
CO2: 25 mmol/L (ref 20–29)
Calcium: 9.2 mg/dL (ref 8.7–10.3)
Chloride: 104 mmol/L (ref 96–106)
Creatinine, Ser: 0.98 mg/dL (ref 0.57–1.00)
Globulin, Total: 3.1 g/dL (ref 1.5–4.5)
Glucose: 105 mg/dL — ABNORMAL HIGH (ref 70–99)
Potassium: 4.3 mmol/L (ref 3.5–5.2)
Sodium: 142 mmol/L (ref 134–144)
Total Protein: 7 g/dL (ref 6.0–8.5)
eGFR: 63 mL/min/{1.73_m2} (ref >59)

## 2023-09-18 LAB — CBC
Hematocrit: 40.1 % (ref 34.0–46.6)
Hemoglobin: 13.5 g/dL (ref 11.1–15.9)
MCH: 32.1 pg (ref 26.6–33.0)
MCHC: 33.7 g/dL (ref 31.5–35.7)
MCV: 96 fL (ref 79–97)
Platelets: 259 10*3/uL (ref 150–450)
RBC: 4.2 x10E6/uL (ref 3.77–5.28)
RDW: 12.6 % (ref 11.7–15.4)
WBC: 4.1 10*3/uL (ref 3.4–10.8)

## 2023-09-18 LAB — HEMOGLOBIN A1C
Est. average glucose Bld gHb Est-mCnc: 128 mg/dL
Hgb A1c MFr Bld: 6.1 % — ABNORMAL HIGH (ref 4.8–5.6)

## 2023-09-18 LAB — CK: Total CK: 359 U/L — ABNORMAL HIGH (ref 32–182)

## 2023-09-18 LAB — TSH: TSH: 2.53 u[IU]/mL (ref 0.450–4.500)

## 2023-09-19 ENCOUNTER — Encounter: Payer: Self-pay | Admitting: Internal Medicine

## 2023-09-28 DIAGNOSIS — M791 Myalgia, unspecified site: Secondary | ICD-10-CM | POA: Insufficient documentation

## 2023-09-28 NOTE — Assessment & Plan Note (Signed)
Chronic, she will c/w atorvastatin 80mg  M-F. She is encouraged to follow a heart healthy diet. She will rto in four to six months for re-evaluation. She is encouraged to follow a heart healthy lifestyle.

## 2023-09-28 NOTE — Assessment & Plan Note (Signed)
Her a1c has been elevated in the past. I will recheck an a1c today. Encouraged to follow diet low in refined carbs.

## 2023-09-28 NOTE — Assessment & Plan Note (Signed)
Chronic, well controlled. She will c/w amlodipine 2.5mg  daily. She is encouraged to follow a low sodium diet and c/w regular exercise regimen. F/u 6 months.

## 2023-09-28 NOTE — Assessment & Plan Note (Signed)
She is having intermittent sx. I will check labs as below. She is encouraged to stay well hydrated. She may benefit from Mg supplementation.

## 2023-10-02 ENCOUNTER — Encounter: Payer: Self-pay | Admitting: Internal Medicine

## 2023-10-03 ENCOUNTER — Ambulatory Visit (INDEPENDENT_AMBULATORY_CARE_PROVIDER_SITE_OTHER): Payer: Medicare Other | Admitting: Physician Assistant

## 2023-10-03 ENCOUNTER — Other Ambulatory Visit (INDEPENDENT_AMBULATORY_CARE_PROVIDER_SITE_OTHER): Payer: Self-pay

## 2023-10-03 ENCOUNTER — Encounter: Payer: Self-pay | Admitting: Physician Assistant

## 2023-10-03 DIAGNOSIS — M25512 Pain in left shoulder: Secondary | ICD-10-CM

## 2023-10-03 DIAGNOSIS — G8929 Other chronic pain: Secondary | ICD-10-CM | POA: Diagnosis not present

## 2023-10-03 DIAGNOSIS — M25511 Pain in right shoulder: Secondary | ICD-10-CM | POA: Insufficient documentation

## 2023-10-03 MED ORDER — MELOXICAM 7.5 MG PO TABS: ORAL_TABLET | ORAL | 0 refills | Status: DC

## 2023-10-03 NOTE — Progress Notes (Signed)
 Office Visit Note   Patient: Ruth Davis           Date of Birth: [birth year]           MRN: [id number] Visit Date: 10/03/2023              Requested by: Jarold Medici, MD [address] [place],  [place] [address] PCP: Jarold Medici, MD   Assessment & Plan: Visit Diagnoses:  1. Chronic right shoulder pain   2. Chronic left shoulder pain   3. Chronic pain of both shoulders     Plan: Patient is a 68 year old woman who is a patient of Dr. Jerri.  He last saw her for right shoulder pain about 2 years ago.  He thought findings consistent with rotator cuff tendinitis.  She was not interested in injections so he did call her in some meloxicam .  Unsure if that helped her but it has been a while.  She comes in today with left greater than right shoulder pain and some paresthesias going down her left shoulder.  She had some of this but got worse when she was doing a lot of overhead Christmas decorating.  She did misstep down off the ladder and fall onto her right shoulder but she said right now her left shoulder was more painful she denies any bruising at the time x-rays show a little spasm in the little degenerative changes in her neck as well as her AC joints.  Nothing acute.  Based on exam I she may have a little irritation in the cervical spine on the left side but her strength is completely intact.  She also has findings consistent bilaterally with rotator cuff tendinitis.  I will try her back on some meloxicam  instead of ibuprofen.  Will also refer her to physical therapy for her shoulders and her neck.  Can follow-up with Dr. Jerri once this is complete.  Follow-Up Instructions: No follow-ups on file.   Orders:  Orders Placed This Encounter  Procedures   XR Cervical Spine 2 or 3 views   XR Shoulder Left   Ambulatory referral to Physical Therapy   Meds ordered this encounter  Medications   meloxicam  (MOBIC ) 7.5 MG tablet    Sig: TAKE 1 TABLET TWICE A DAY AS NEEDED FOR  SHOULDER PAIN    Dispense:  60 tablet    Refill:  0      Procedures: No procedures performed   Clinical Data: No additional findings.   Subjective: Chief Complaint  Patient presents with   Left Shoulder - Pain   Right Shoulder - Pain    HPI Pleasant 68 year old woman with left greater than right shoulder pain.  She said she has had some pain in her right shoulder in the past and was seen here by Dr. Jerri.  She said the pain in her shoulders and the left became worse after being on a ladder hanging Christmas decorations about a month ago.  She did fall down from the ladder did not have any bruising or swelling.  Now she says her left shoulder tends to hurt more and she is also describing some feelings that run down to her left hand with a little bit of weakness Review of Systems  All other systems reviewed and are negative.    Objective: Vital Signs: There were no vitals taken for this visit.  Physical Exam Constitutional:      Appearance: Normal appearance.  Pulmonary:     Effort: Pulmonary effort  is normal.  Neurological:     General: No focal deficit present.     Mental Status: She is alert and oriented to person, place, and time.  Psychiatric:        Mood and Affect: Mood normal.        Behavior: Behavior normal.     Ortho Exam Neck she has full range of motion without pain.  No radicular findings grip strength intact. Left shoulder and right shoulder full forward elevation though she is does says she feels some pulling.  She has internal rotation behind the back is equal.  Also says she feels some pulling.  Has a mildly positive empty can test more positive speeds test.  Has good biceps and triceps strength.  Good strength with external rotation Specialty Comments:  No specialty comments available.  Imaging: XR Shoulder Left Result Date: 10/03/2023 Radiographs of her left shoulder demonstrate a little degenerative changes of the [facility] joint no evidence of  fracture  XR Cervical Spine 2 or 3 views Result Date: 10/03/2023 Cervical spine demonstrate straightening of the normal lordotic curve.  She does have some early degenerative changes no acute fractures noted    PMFS History: Patient Active Problem List   Diagnosis Date Noted   Bilateral shoulder pain 10/03/2023   Myalgia 09/28/2023   COVID 03/26/2023   Acute non-recurrent maxillary sinusitis 03/26/2023   Essential hypertension, benign 10/17/2022   Other abnormal glucose 10/17/2022   Vitamin D  deficiency disease 10/17/2022   Class 1 obesity due to excess calories without serious comorbidity with body mass index (BMI) of 32.0 to 32.9 in adult 10/17/2022   Pure hypercholesterolemia 09/10/2018   Abnormal thyroid blood test 09/10/2018   Past Medical History:  Diagnosis Date   Allergic rhinitis    Allergy    Essential hypertension, benign    High cholesterol     Family History  Problem Relation Age of Onset   Breast cancer Sister    Breast cancer Maternal Grandmother    Alzheimer's disease Mother    Bone cancer Father    Throat cancer Brother    Breast cancer Sister    Colon cancer Sister     Past Surgical History:  Procedure Laterality Date   REDUCTION MAMMAPLASTY Bilateral 2008   Social History   Occupational History   Not on file  Tobacco Use   Smoking status: Never   Smokeless tobacco: Never  Vaping Use   Vaping status: Never Used  Substance and Sexual Activity   Alcohol use: Never   Drug use: Never   Sexual activity: Not on file

## 2023-10-08 ENCOUNTER — Encounter: Payer: Self-pay | Admitting: Rehabilitative and Restorative Service Providers"

## 2023-10-08 ENCOUNTER — Ambulatory Visit: Payer: Medicare Other | Admitting: Rehabilitative and Restorative Service Providers"

## 2023-10-08 DIAGNOSIS — M25512 Pain in left shoulder: Secondary | ICD-10-CM

## 2023-10-08 DIAGNOSIS — M25511 Pain in right shoulder: Secondary | ICD-10-CM | POA: Diagnosis not present

## 2023-10-08 DIAGNOSIS — M6281 Muscle weakness (generalized): Secondary | ICD-10-CM

## 2023-10-08 DIAGNOSIS — M542 Cervicalgia: Secondary | ICD-10-CM | POA: Diagnosis not present

## 2023-10-08 DIAGNOSIS — G8929 Other chronic pain: Secondary | ICD-10-CM

## 2023-10-08 DIAGNOSIS — R293 Abnormal posture: Secondary | ICD-10-CM

## 2023-10-08 NOTE — Therapy (Signed)
 OUTPATIENT PHYSICAL THERAPY EVALUATION   Patient Name: Ruth Davis MRN: [id number] DOB:[birth year], 68 y.o., female Today's Date: 10/08/2023  END OF SESSION:  PT End of Session - 10/08/23 0842     Visit Number 1    Number of Visits 20    Date for PT Re-Evaluation 12/17/23    Authorization Type Medicare and BCBS    Authorization - Visit Number 1    Progress Note Due on Visit 10    PT Start Time 0845    PT Stop Time 0912    PT Time Calculation (min) 27 min    Activity Tolerance Patient tolerated treatment well    Behavior During Therapy [facility] for tasks assessed/performed             Past Medical History:  Diagnosis Date   Allergic rhinitis    Allergy    Essential hypertension, benign    High cholesterol    Past Surgical History:  Procedure Laterality Date   REDUCTION MAMMAPLASTY Bilateral 2008   Patient Active Problem List   Diagnosis Date Noted   Bilateral shoulder pain 10/03/2023   Myalgia 09/28/2023   COVID 03/26/2023   Acute non-recurrent maxillary sinusitis 03/26/2023   Essential hypertension, benign 10/17/2022   Other abnormal glucose 10/17/2022   Vitamin D  deficiency disease 10/17/2022   Class 1 obesity due to excess calories without serious comorbidity with body mass index (BMI) of 32.0 to 32.9 in adult 10/17/2022   Pure hypercholesterolemia 09/10/2018   Abnormal thyroid blood test 09/10/2018    PCP: Jarold Medici MD  REFERRING PROVIDER: Persons, Ronal Dragon, [place]  REFERRING DIAG: [phone] (ICD-10-CM) - Chronic left shoulder pain M25.511,G89.29 (ICD-10-CM) - Chronic right shoulder pain  THERAPY DIAG:  Cervicalgia  Chronic left shoulder pain  Chronic right shoulder pain  Muscle weakness (generalized)  Abnormal posture  Rationale for Evaluation and Treatment: Rehabilitation  ONSET DATE: Several months, fall off ladder in 08/2023.   SUBJECTIVE:                                                                                                                                                                                                          SUBJECTIVE STATEMENT: Complaints of neck pain, bilateral shoulder/arm pain. Pt indicated previously having some complaints in Rt shoulder that PT and medicine helped.  Then a couple months symptoms returned.  Reported in Dec falling off ladder that seemed to possibly aggravate symptoms in both arms and neck area.  Pt indicated greatest pain noted at night with throbbing complaints and dull ache in both arms.  Reported OTC medicine didn't seem to help much so she made visit at MD office.  Xrays were performed and meloxicam  with some help initially.  Reported tightness in neck at times but not specifically painful.   Denied numbness/tingling, no vision changes, dizziness.   PERTINENT HISTORY:  See above.   PAIN:  NPRS scale: at worst 7-8/10, at current in clinic:  1/10 Pain location: bilateral shoulder/UE, neck .  Arm symptoms stay above elbow  Pain description: throbbing complaints, pressure, achy Aggravating factors: nighttime Relieving factors: Medicine  PRECAUTIONS: None  RED FLAGS: None observed  WEIGHT BEARING RESTRICTIONS: No  FALLS:  Has patient fallen in last 6 months? 1 fall without fear of falling.   LIVING ENVIRONMENT: Lives in: House/apartment  OCCUPATION: Desk based work  PLOF: Independent, 30 mins aerobic exercise, walking.    PATIENT GOALS: Reduce pain   OBJECTIVE:   PATIENT SURVEYS:  10/08/2023 FOTO intake: 70   predicted:  73  COGNITION: 10/08/2023 Overall cognitive status: Within functional limits for tasks assessed  SENSATION: 10/08/2023 No specific limitations.   POSTURE:  10/08/2023 Rounded shoulders, reduced cervical distraction  PALPATION: 10/08/2023 Trigger points in infraspinatus bilaterally    CERVICAL ROM:   ROM AROM (deg) 10/08/2023  Flexion 40  Extension 54  Right lateral flexion   Left lateral flexion   Right rotation 80   Left rotation 62   (Blank rows = not tested)  UPPER EXTREMITY ROM:   ROM Right 10/08/2023 Left 10/08/2023  Shoulder flexion    Shoulder extension    Shoulder abduction    Shoulder adduction    Shoulder extension    Shoulder internal rotation    Shoulder external rotation 70 PROM in supine 45 deg abduction 85 PROM in supine 45 deg abduction  Elbow flexion    Elbow extension    Wrist flexion    Wrist extension    Wrist ulnar deviation    Wrist radial deviation    Wrist pronation    Wrist supination     (Blank rows = not tested)  UPPER EXTREMITY MMT:  MMT Right 10/08/2023 Left 10/08/2023  Shoulder flexion 4/5 c pain 5/5  Shoulder extension    Shoulder abduction 4/5 c pain 5/5  Shoulder adduction    Shoulder extension    Shoulder internal rotation 5/5 5/5  Shoulder external rotation 4+/5 5/5  Middle trapezius    Lower trapezius    Elbow flexion 5/5 5/5  Elbow extension 5/5 5/5  Wrist flexion    Wrist extension    Wrist ulnar deviation    Wrist radial deviation    Wrist pronation    Wrist supination    Grip strength     (Blank rows = not tested)  SPECIAL TESTS:  10/08/2023 (-) Spurlings compression, distraction.   FUNCTIONAL TESTS:  10/08/2023 No specific testing.  TODAY'S TREATMENT:                                                                                                       DATE: 10/08/2023 Therex:    HEP instruction/performance c cues for techniques, handout provided.  Trial set performed of each for comprehension and symptom assessment.  See below for exercise list  PATIENT EDUCATION:  10/08/2023 Education details: HEP, POC Person educated: Patient Education method: Explanation, Demonstration, Verbal cues, and Handouts Education comprehension: verbalized understanding, returned  demonstration, and verbal cues required  HOME EXERCISE PROGRAM: Access Code: [email or web address] URL: [email or web address] Date: 10/08/2023 Prepared by: Ozell Silvan  Exercises - Supine Deep Neck Flexor Training  - 2 x daily - 7 x weekly - 1 sets - 10 reps - 5 hold - Cervical Retraction at Wall  - 2 x daily - 7 x weekly - 1 sets - 10 reps - 5 hold - Seated Scapular Retraction  - 3-5 x daily - 7 x weekly - 1 sets - 10 reps - 3-5 hold - Standing Shoulder Posterior Capsule Stretch (Mirrored)  - 2 x daily - 7 x weekly - 1 sets - 5 reps - 15-30 hold - Shoulder External Rotation and Scapular Retraction with Resistance  - 1 x daily - 7 x weekly - 1-2 sets - 10-15 reps - Standing Bilateral Low Shoulder Row with Anchored Resistance  - 1 x daily - 7 x weekly - 1-2 sets - 10-15 reps - Shoulder Extension with Resistance  - 1 x daily - 7 x weekly - 1-2 sets - 10-15 reps  ASSESSMENT:  CLINICAL IMPRESSION: Patient is a 68 y.o. who comes to clinic with complaints of cervical tightness, bilateral shoulder pain with mobility, strength and movement coordination deficits that impair their ability to perform usual daily and recreational functional activities without increase difficulty/symptoms at this time.  Presentation today didn't seem to indicate specific radicular connection. Patient to benefit from skilled PT services to address impairments and limitations to improve to previous level of function without restriction secondary to condition.    OBJECTIVE IMPAIRMENTS: decreased activity tolerance, decreased endurance, decreased mobility, decreased ROM, decreased strength, increased fascial restrictions, impaired perceived functional ability, impaired flexibility, impaired UE functional use, improper body mechanics, postural dysfunction, and pain.   ACTIVITY LIMITATIONS: carrying, lifting, sitting, sleeping, and reach over head  PARTICIPATION LIMITATIONS: interpersonal relationship, community  activity, and occupation  PERSONAL FACTORS:  no specific factors today  are also affecting patient's functional outcome.   REHAB POTENTIAL: Good  CLINICAL DECISION MAKING: Stable/uncomplicated  EVALUATION COMPLEXITY: Low   GOALS: Goals reviewed with patient? Yes  SHORT TERM GOALS: (target date for Short term goals are 3 weeks 10/29/2023)  1.Patient will demonstrate independent use of home exercise program to maintain progress from in clinic treatments. Goal status: New  LONG TERM GOALS: (target dates for all long term goals are 10 weeks  12/17/2023 )   1. Patient will demonstrate/report pain at worst less than or equal to 2/10 to facilitate minimal limitation in daily activity secondary to pain symptoms. Goal status:  New   2. Patient will demonstrate independent use of home exercise program to facilitate ability to maintain/progress functional gains from skilled physical therapy services. Goal status: New   3. Patient will demonstrate FOTO outcome > or = 73 % to indicate reduced disability due to condition. Goal status: New   4.  Patient will demonstrate cervical AROM WFL s symptoms to facilitate usual head movements for daily activity including driving, self care.   Goal status: New   5.  Patient will demonstrate bilateral shoulder MMT 5/5 throughout to facilitate strength at [facility] for daily activity.   Goal status: New   6.  Patient will demonstrate ability to sleep s restriction due to symptoms.  Goal status: New    PLAN:  PT FREQUENCY: 1-2x/week  PT DURATION: 10 weeks  Can include 02853- PT Re-evaluation, 97110-Therapeutic exercises, 97530- Therapeutic activity, [id number]- Neuromuscular re-education, 97535- Self Care, 97140- Manual therapy, [phone]- Gait training, [phone]- Orthotic Fit/training, [phone]- Canalith repositioning, [id number]- Aquatic Therapy, 97014- Electrical stimulation (unattended), [id number]- Electrical stimulation (manual), [id number]- Vasopneumatic device, [id number]-  Ultrasound, [id number]- Traction (mechanical), [id number]- Ionotophoresis 4mg /ml Dexamethasone, Patient/Family education, Balance training, Stair training, Taping, Dry Needling, Joint mobilization, Joint manipulation, Spinal manipulation, Spinal mobilization, Scar mobilization, Vestibular training, Visual/preceptual remediation/compensation, DME instructions, Cryotherapy, and Moist heat.  All performed as medically necessary.  All included unless contraindicated  PLAN FOR NEXT SESSION: Check HEP use/response.  Shoulder strengthening, posterior strengthening.  Keep eye on any cervical connection.    Ozell Silvan, PT, DPT, OCS, ATC 10/08/23  9:19 AM

## 2023-10-15 ENCOUNTER — Ambulatory Visit (INDEPENDENT_AMBULATORY_CARE_PROVIDER_SITE_OTHER): Payer: Medicare Other | Admitting: Rehabilitative and Restorative Service Providers"

## 2023-10-15 ENCOUNTER — Encounter: Payer: Self-pay | Admitting: Rehabilitative and Restorative Service Providers"

## 2023-10-15 DIAGNOSIS — M25512 Pain in left shoulder: Secondary | ICD-10-CM

## 2023-10-15 DIAGNOSIS — G8929 Other chronic pain: Secondary | ICD-10-CM

## 2023-10-15 DIAGNOSIS — M542 Cervicalgia: Secondary | ICD-10-CM | POA: Diagnosis not present

## 2023-10-15 DIAGNOSIS — M6281 Muscle weakness (generalized): Secondary | ICD-10-CM

## 2023-10-15 DIAGNOSIS — R293 Abnormal posture: Secondary | ICD-10-CM

## 2023-10-15 DIAGNOSIS — M25511 Pain in right shoulder: Secondary | ICD-10-CM | POA: Diagnosis not present

## 2023-10-15 NOTE — Therapy (Signed)
 OUTPATIENT PHYSICAL THERAPY EVALUATION   Patient Name: Ruth Davis MRN: [id number] DOB:04/08/1956, 68 y.o., female Today's Date: 10/15/2023  END OF SESSION:  PT End of Session - 10/15/23 1225     Visit Number 2    Number of Visits 20    Date for PT Re-Evaluation 12/17/23    Authorization Type Medicare and BCBS    Authorization - Visit Number 2    Progress Note Due on Visit 10    PT Start Time 1143    PT Stop Time 1223    PT Time Calculation (min) 40 min    Activity Tolerance Patient tolerated treatment well    Behavior During Therapy [facility] for tasks assessed/performed              Past Medical History:  Diagnosis Date   Allergic rhinitis    Allergy    Essential hypertension, benign    High cholesterol    Past Surgical History:  Procedure Laterality Date   REDUCTION MAMMAPLASTY Bilateral 2008   Patient Active Problem List   Diagnosis Date Noted   Bilateral shoulder pain 10/03/2023   Myalgia 09/28/2023   COVID 03/26/2023   Acute non-recurrent maxillary sinusitis 03/26/2023   Essential hypertension, benign 10/17/2022   Other abnormal glucose 10/17/2022   Vitamin D  deficiency disease 10/17/2022   Class 1 obesity due to excess calories without serious comorbidity with body mass index (BMI) of 32.0 to 32.9 in adult 10/17/2022   Pure hypercholesterolemia 09/10/2018   Abnormal thyroid blood test 09/10/2018    PCP: Cleave Curling MD  REFERRING PROVIDER: Persons, Norma Beckers, [place]  REFERRING DIAG: [phone] (ICD-10-CM) - Chronic left shoulder pain M25.511,G89.29 (ICD-10-CM) - Chronic right shoulder pain  THERAPY DIAG:  Cervicalgia  Chronic left shoulder pain  Chronic right shoulder pain  Muscle weakness (generalized)  Abnormal posture  Rationale for Evaluation and Treatment: Rehabilitation  ONSET DATE: Several months, fall off ladder in 08/2023.   SUBJECTIVE:                                                                                                                                                                                                          SUBJECTIVE STATEMENT: Generally better than last session.  Pt indicated symptoms in lying down still noted on Lt but generally better on Lt than Rt with activity.  Reported lifting for travel was troublesome.   PERTINENT HISTORY:  See above.   PAIN:  NPRS scale: at worst 7/10, at current in clinic:  1/10 Pain location: bilateral shoulder/UE, neck .  Arm symptoms  stay above elbow  Pain description: throbbing complaints, pressure, achy Aggravating factors: travel and pulling/carrying luggage Relieving factors: Medicine  PRECAUTIONS: None  RED FLAGS: None observed  WEIGHT BEARING RESTRICTIONS: No  FALLS:  Has patient fallen in last 6 months? 1 fall without fear of falling.   LIVING ENVIRONMENT: Lives in: House/apartment  OCCUPATION: Desk based work  PLOF: Independent, 30 mins aerobic exercise, walking.    PATIENT GOALS: Reduce pain   OBJECTIVE:   PATIENT SURVEYS:  10/08/2023 FOTO intake: 70   predicted:  73  COGNITION: 10/08/2023 Overall cognitive status: Within functional limits for tasks assessed  SENSATION: 10/08/2023 No specific limitations.   POSTURE:  10/08/2023 Rounded shoulders, reduced cervical distraction  PALPATION: 10/08/2023 Trigger points in infraspinatus bilaterally    CERVICAL ROM:   ROM AROM (deg) 10/08/2023  Flexion 40  Extension 54  Right lateral flexion   Left lateral flexion   Right rotation 80  Left rotation 62   (Blank rows = not tested)  UPPER EXTREMITY ROM:   ROM Right 10/08/2023 Left 10/08/2023  Shoulder flexion    Shoulder extension    Shoulder abduction    Shoulder adduction    Shoulder extension    Shoulder internal rotation    Shoulder external rotation 70 PROM in supine 45 deg abduction 85 PROM in supine 45 deg abduction  Elbow flexion    Elbow extension    Wrist flexion    Wrist extension    Wrist ulnar  deviation    Wrist radial deviation    Wrist pronation    Wrist supination     (Blank rows = not tested)  UPPER EXTREMITY MMT:  MMT Right 10/08/2023 Left 10/08/2023  Shoulder flexion 4/5 c pain 5/5  Shoulder extension    Shoulder abduction 4/5 c pain 5/5  Shoulder adduction    Shoulder extension    Shoulder internal rotation 5/5 5/5  Shoulder external rotation 4+/5 5/5  Middle trapezius    Lower trapezius    Elbow flexion 5/5 5/5  Elbow extension 5/5 5/5  Wrist flexion    Wrist extension    Wrist ulnar deviation    Wrist radial deviation    Wrist pronation    Wrist supination    Grip strength     (Blank rows = not tested)  SPECIAL TESTS:  10/15/2023 Radial ULTT Lt(+) Median ULTT Lt (-)  10/08/2023 (-) Spurlings compression, distraction.   FUNCTIONAL TESTS:  10/08/2023 No specific testing.                                                                                                                                                                                 TODAY'S TREATMENT:  DATE: 10/15/2023 Therex: UBE fwd backward level 2 Standing GTB ER walkout isometric with towel under arm 10x5sec hold ea side Standing GTB rows/extension 2x15ea bilaterally Supine chin tucks 10x5sec; vc for proper form Supine shoulder retractions Supine posterior capsule stretch of Lt shoulder 2x20sec  TODAY'S TREATMENT:                                                                                                       DATE: 10/08/2023 Therex:    HEP instruction/performance c cues for techniques, handout provided.  Trial set performed of each for comprehension and symptom assessment.  See below for exercise list  PATIENT EDUCATION:  10/08/2023 Education details: HEP, POC Person educated: Patient Education method: Explanation, Demonstration, Verbal cues, and Handouts Education  comprehension: verbalized understanding, returned demonstration, and verbal cues required  HOME EXERCISE PROGRAM: Access Code: [email or web address] URL: [email or web address] Date: 10/08/2023 Prepared by: Bonna Bustard  Exercises - Supine Deep Neck Flexor Training  - 2 x daily - 7 x weekly - 1 sets - 10 reps - 5 hold - Cervical Retraction at Wall  - 2 x daily - 7 x weekly - 1 sets - 10 reps - 5 hold - Seated Scapular Retraction  - 3-5 x daily - 7 x weekly - 1 sets - 10 reps - 3-5 hold - Standing Shoulder Posterior Capsule Stretch (Mirrored)  - 2 x daily - 7 x weekly - 1 sets - 5 reps - 15-30 hold - Shoulder External Rotation and Scapular Retraction with Resistance  - 1 x daily - 7 x weekly - 1-2 sets - 10-15 reps - Standing Bilateral Low Shoulder Row with Anchored Resistance  - 1 x daily - 7 x weekly - 1-2 sets - 10-15 reps - Shoulder Extension with Resistance  - 1 x daily - 7 x weekly - 1-2 sets - 10-15 reps  ASSESSMENT:  CLINICAL IMPRESSION: Generally better quality of movement noted within visit today.  Some indications of possible nerve tension for Lt arm symptoms, will continue to reassess next visit.  Pt may continue to benefit from skilled PT services to improve functional movement and symptoms.    OBJECTIVE IMPAIRMENTS: decreased activity tolerance, decreased endurance, decreased mobility, decreased ROM, decreased strength, increased fascial restrictions, impaired perceived functional ability, impaired flexibility, impaired UE functional use, improper body mechanics, postural dysfunction, and pain.   ACTIVITY LIMITATIONS: carrying, lifting, sitting, sleeping, and reach over head  PARTICIPATION LIMITATIONS: interpersonal relationship, community activity, and occupation  PERSONAL FACTORS:  no specific factors today  are also affecting patient's functional outcome.   REHAB POTENTIAL: Good  CLINICAL DECISION MAKING: Stable/uncomplicated  EVALUATION COMPLEXITY:  Low   GOALS: Goals reviewed with patient? Yes  SHORT TERM GOALS: (target date for Short term goals are 3 weeks 10/29/2023)  1.Patient will demonstrate independent use of home exercise program to maintain progress from in clinic treatments. Goal status: on going 10/15/2023  LONG TERM GOALS: (target dates for all long term goals are 10 weeks  12/17/2023 )   1. Patient will demonstrate/report pain at worst less than or  equal to 2/10 to facilitate minimal limitation in daily activity secondary to pain symptoms. Goal status: New   2. Patient will demonstrate independent use of home exercise program to facilitate ability to maintain/progress functional gains from skilled physical therapy services. Goal status: New   3. Patient will demonstrate FOTO outcome > or = 73 % to indicate reduced disability due to condition. Goal status: New   4.  Patient will demonstrate cervical AROM WFL s symptoms to facilitate usual head movements for daily activity including driving, self care.   Goal status: New   5.  Patient will demonstrate bilateral shoulder MMT 5/5 throughout to facilitate strength at [facility] for daily activity.   Goal status: New   6.  Patient will demonstrate ability to sleep s restriction due to symptoms.  Goal status: New    PLAN:  PT FREQUENCY: 1-2x/week  PT DURATION: 10 weeks  Can include 16109- PT Re-evaluation, 97110-Therapeutic exercises, 97530- Therapeutic activity, [id number]- Neuromuscular re-education, 97535- Self Care, 97140- Manual therapy, [id number]- Gait training, [phone]- Orthotic Fit/training, [phone]- Canalith repositioning, [id number]- Aquatic Therapy, 97014- Electrical stimulation (unattended), [id number]- Electrical stimulation (manual), [id number]- Vasopneumatic device, [id number]- Ultrasound, [id number]- Traction (mechanical), [id number]- Ionotophoresis 4mg /ml Dexamethasone, Patient/Family education, Balance training, Stair training, Taping, Dry Needling, Joint mobilization, Joint manipulation, Spinal  manipulation, Spinal mobilization, Scar mobilization, Vestibular training, Visual/preceptual remediation/compensation, DME instructions, Cryotherapy, and Moist heat.  All performed as medically necessary.  All included unless contraindicated  PLAN FOR NEXT SESSION: Recheck neural testing for Lt arm.   Bonna Bustard, PT, DPT, OCS, ATC 10/15/23  12:26 PM

## 2023-10-20 ENCOUNTER — Ambulatory Visit (INDEPENDENT_AMBULATORY_CARE_PROVIDER_SITE_OTHER): Payer: Medicare Other | Admitting: Rehabilitative and Restorative Service Providers"

## 2023-10-20 ENCOUNTER — Encounter: Payer: Self-pay | Admitting: Rehabilitative and Restorative Service Providers"

## 2023-10-20 DIAGNOSIS — M6281 Muscle weakness (generalized): Secondary | ICD-10-CM | POA: Diagnosis not present

## 2023-10-20 DIAGNOSIS — M542 Cervicalgia: Secondary | ICD-10-CM

## 2023-10-20 DIAGNOSIS — M25511 Pain in right shoulder: Secondary | ICD-10-CM | POA: Diagnosis not present

## 2023-10-20 DIAGNOSIS — G8929 Other chronic pain: Secondary | ICD-10-CM

## 2023-10-20 DIAGNOSIS — M25512 Pain in left shoulder: Secondary | ICD-10-CM | POA: Diagnosis not present

## 2023-10-20 DIAGNOSIS — R293 Abnormal posture: Secondary | ICD-10-CM

## 2023-10-20 NOTE — Therapy (Signed)
OUTPATIENT PHYSICAL THERAPY TREATMENT   Patient Name: Ruth Davis MRN: [id number] DOB:[birth year], 68 y.o., female Today's Date: 10/20/2023  END OF SESSION:  PT End of Session - 10/20/23 1014     Visit Number 3    Number of Visits 20    Date for PT Re-Evaluation 12/17/23    Authorization Type Medicare and BCBS    Authorization - Visit Number 3    Progress Note Due on Visit 10    PT Start Time 1011    PT Stop Time 1050    PT Time Calculation (min) 39 min    Activity Tolerance Patient tolerated treatment well    Behavior During Therapy WFL for tasks assessed/performed               Past Medical History:  Diagnosis Date   Allergic rhinitis    Allergy    Essential hypertension, benign    High cholesterol    Past Surgical History:  Procedure Laterality Date   REDUCTION MAMMAPLASTY Bilateral 2008   Patient Active Problem List   Diagnosis Date Noted   Bilateral shoulder pain 10/03/2023   Myalgia 09/28/2023   COVID 03/26/2023   Acute non-recurrent maxillary sinusitis 03/26/2023   Essential hypertension, benign 10/17/2022   Other abnormal glucose 10/17/2022   Vitamin D deficiency disease 10/17/2022   Class 1 obesity due to excess calories without serious comorbidity with body mass index (BMI) of 32.0 to 32.9 in adult 10/17/2022   Pure hypercholesterolemia 09/10/2018   Abnormal thyroid blood test 09/10/2018    PCP: Dorothyann Peng MD  REFERRING PROVIDER: Persons, [place], [place]  REFERRING DIAG: [phone] (ICD-10-CM) - Chronic left shoulder pain M25.511,G89.29 (ICD-10-CM) - Chronic right shoulder pain  THERAPY DIAG:  Cervicalgia  Chronic left shoulder pain  Chronic right shoulder pain  Muscle weakness (generalized)  Abnormal posture  Rationale for Evaluation and Treatment: Rehabilitation  ONSET DATE: Several months, fall off ladder in 08/2023.   SUBJECTIVE:                                                                                                                                                                                                          SUBJECTIVE STATEMENT: Pt indicated no pain upon arrival.  Pt indicated feeling a tug in the shoulder joint when moving to quick.  Pt indicated doing the exercise.  Less frequent nighttime stuff.   PERTINENT HISTORY:  See above.   PAIN:  NPRS scale: 0/10 Pain location: bilateral shoulder/UE, neck .  Arm symptoms stay above elbow  Pain description: throbbing complaints,  pressure, achy Aggravating factors: travel and pulling/carrying luggage Relieving factors: Medicine  PRECAUTIONS: None  RED FLAGS: None observed  WEIGHT BEARING RESTRICTIONS: No  FALLS:  Has patient fallen in last 6 months? 1 fall without fear of falling.   LIVING ENVIRONMENT: Lives in: House/apartment  OCCUPATION: Desk based work  PLOF: Independent, 30 mins aerobic exercise, walking.    PATIENT GOALS: Reduce pain   OBJECTIVE:   PATIENT SURVEYS:  10/08/2023 FOTO intake: 70   predicted:  73  COGNITION: 10/08/2023 Overall cognitive status: Within functional limits for tasks assessed  SENSATION: 10/08/2023 No specific limitations.   POSTURE:  10/08/2023 Rounded shoulders, reduced cervical distraction  PALPATION: 10/08/2023 Trigger points in infraspinatus bilaterally    CERVICAL ROM:   ROM AROM (deg) 10/08/2023 AROM 10/20/2023  Flexion 40   Extension 54   Right lateral flexion    Left lateral flexion    Right rotation 80 80  Left rotation 62 75   (Blank rows = not tested)  UPPER EXTREMITY ROM:   ROM Right 10/08/2023 Left 10/08/2023  Shoulder flexion    Shoulder extension    Shoulder abduction    Shoulder adduction    Shoulder extension    Shoulder internal rotation    Shoulder external rotation 70 PROM in supine 45 deg abduction 85 PROM in supine 45 deg abduction  Elbow flexion    Elbow extension    Wrist flexion    Wrist extension    Wrist ulnar deviation    Wrist radial  deviation    Wrist pronation    Wrist supination     (Blank rows = not tested)  UPPER EXTREMITY MMT:  MMT Right 10/08/2023 Left 10/08/2023 Right 10/20/2023 Left 10/20/2023  Shoulder flexion 4/5 c pain 5/5 4+/5 5/5  Shoulder extension      Shoulder abduction 4/5 c pain 5/5 4+/5 5/5  Shoulder adduction      Shoulder extension      Shoulder internal rotation 5/5 5/5 5/5 5/5  Shoulder external rotation 4+/5 5/5 5/5 5/5  Middle trapezius      Lower trapezius      Elbow flexion 5/5 5/5    Elbow extension 5/5 5/5    Wrist flexion      Wrist extension      Wrist ulnar deviation      Wrist radial deviation      Wrist pronation      Wrist supination      Grip strength       (Blank rows = not tested)  SPECIAL TESTS:  10/15/2023 Radial ULTT Lt(+) Median ULTT Lt (-)  10/08/2023 (-) Spurlings compression, distraction.   FUNCTIONAL TESTS:  10/08/2023 No specific testing.  TODAY'S TREATMENT:                                                                                                       DATE: 10/20/2023 Therex: UBE fwd backward level 2.5 Standing GTB ER with towel under arm bilaterally 2 x 10 with slow control focus  Standing GTB rows/extension 2x15ea bilaterally Standing cross arm stretch 15 sec x 5 bilaterally Supine horizontal green band abduction bilateral 2 x 15 Supine cervical retraction 5 sec hold x 10 Supine scapular retraction 5 sec hold x 10   Manual Median nerve flossing with arm towards side in supine.   TODAY'S TREATMENT:                                                                                                       DATE: 10/15/2023 Therex: UBE fwd backward level 2 Standing GTB ER walkout isometric with towel under arm 10x5sec hold ea side Standing GTB rows/extension  2x15ea bilaterally Supine chin tucks 10x5sec; vc for proper form Supine shoulder retractions Supine posterior capsule stretch of Lt shoulder 2x20sec  TODAY'S TREATMENT:                                                                                                       DATE: 10/08/2023 Therex:    HEP instruction/performance c cues for techniques, handout provided.  Trial set performed of each for comprehension and symptom assessment.  See below for exercise list  PATIENT EDUCATION:  10/08/2023 Education details: HEP, POC Person educated: Patient Education method: Explanation, Demonstration, Verbal cues, and Handouts Education comprehension: verbalized understanding, returned demonstration, and verbal cues required  HOME EXERCISE PROGRAM: Access Code: [email or web address] URL: [email or web address] Date: 10/20/2023 Prepared by: Chyrel Masson  Exercises - Supine Deep Neck Flexor Training  - 2 x daily - 7 x weekly - 1 sets - 10 reps - 5 hold - Cervical Retraction at Wall  - 2 x daily - 7 x weekly - 1 sets - 10 reps - 5 hold - Seated Scapular Retraction  - 3-5 x daily - 7 x weekly - 1 sets - 10 reps - 3-5 hold - Standing Shoulder Posterior Capsule Stretch (Mirrored)  - 2 x daily -  7 x weekly - 1 sets - 5 reps - 15-30 hold - Shoulder External Rotation and Scapular Retraction with Resistance  - 1 x daily - 7 x weekly - 1-2 sets - 10-15 reps - Standing Bilateral Low Shoulder Row with Anchored Resistance  - 1 x daily - 7 x weekly - 1-2 sets - 10-15 reps - Shoulder Extension with Resistance  - 1 x daily - 7 x weekly - 1-2 sets - 10-15 reps - Shoulder External Rotation with Anchored Resistance  - 2 x daily - 7 x weekly - 3 sets - 10 reps  ASSESSMENT:  CLINICAL IMPRESSION: Testing today showed no never tension response as noted in last visit.  Pt to benefit from continued inclusion of progression of strengthening for shoulder and periscapular musculature.    OBJECTIVE IMPAIRMENTS:  decreased activity tolerance, decreased endurance, decreased mobility, decreased ROM, decreased strength, increased fascial restrictions, impaired perceived functional ability, impaired flexibility, impaired UE functional use, improper body mechanics, postural dysfunction, and pain.   ACTIVITY LIMITATIONS: carrying, lifting, sitting, sleeping, and reach over head  PARTICIPATION LIMITATIONS: interpersonal relationship, community activity, and occupation  PERSONAL FACTORS:  no specific factors today  are also affecting patient's functional outcome.   REHAB POTENTIAL: Good  CLINICAL DECISION MAKING: Stable/uncomplicated  EVALUATION COMPLEXITY: Low   GOALS: Goals reviewed with patient? Yes  SHORT TERM GOALS: (target date for Short term goals are 3 weeks 10/29/2023)  1.Patient will demonstrate independent use of home exercise program to maintain progress from in clinic treatments. Goal status: on going 10/15/2023  LONG TERM GOALS: (target dates for all long term goals are 10 weeks  12/17/2023 )   1. Patient will demonstrate/report pain at worst less than or equal to 2/10 to facilitate minimal limitation in daily activity secondary to pain symptoms. Goal status: on going 10/20/2023   2. Patient will demonstrate independent use of home exercise program to facilitate ability to maintain/progress functional gains from skilled physical therapy services. Goal status: on going 10/20/2023   3. Patient will demonstrate FOTO outcome > or = 73 % to indicate reduced disability due to condition. Goal status: on going 10/20/2023   4.  Patient will demonstrate cervical AROM WFL s symptoms to facilitate usual head movements for daily activity including driving, self care.   Goal status: on going 10/20/2023   5.  Patient will demonstrate bilateral shoulder MMT 5/5 throughout to facilitate strength at [facility] for daily activity.   Goal status: on going 10/20/2023   6.  Patient will demonstrate ability to  sleep s restriction due to symptoms.  Goal status: on going 10/20/2023    PLAN:  PT FREQUENCY: 1-2x/week  PT DURATION: 10 weeks  Can include 08657- PT Re-evaluation, 97110-Therapeutic exercises, 97530- Therapeutic activity, 97112- Neuromuscular re-education, 97535- Self Care, 97140- Manual therapy, [phone]- Gait training, [phone]- Orthotic Fit/training, [phone]- Canalith repositioning, [id number]- Aquatic Therapy, 97014- Electrical stimulation (unattended), [id number]- Electrical stimulation (manual), [id number]- Vasopneumatic device, [id number]- Ultrasound, [id number]- Traction (mechanical), [id number]- Ionotophoresis 4mg /ml Dexamethasone, Patient/Family education, Balance training, Stair training, Taping, Dry Needling, Joint mobilization, Joint manipulation, Spinal manipulation, Spinal mobilization, Scar mobilization, Vestibular training, Visual/preceptual remediation/compensation, DME instructions, Cryotherapy, and Moist heat.  All performed as medically necessary.  All included unless contraindicated  PLAN FOR NEXT SESSION:  FOTO reassessment possible if symptoms are still improving.   Chyrel Masson, PT, DPT, OCS, ATC 10/20/23  10:49 AM

## 2023-10-23 ENCOUNTER — Other Ambulatory Visit: Payer: Self-pay | Admitting: Internal Medicine

## 2023-10-23 DIAGNOSIS — Z1231 Encounter for screening mammogram for malignant neoplasm of breast: Secondary | ICD-10-CM

## 2023-10-27 ENCOUNTER — Ambulatory Visit (INDEPENDENT_AMBULATORY_CARE_PROVIDER_SITE_OTHER): Payer: Medicare Other | Admitting: Physical Therapy

## 2023-10-27 ENCOUNTER — Encounter: Payer: Self-pay | Admitting: Physical Therapy

## 2023-10-27 DIAGNOSIS — M25511 Pain in right shoulder: Secondary | ICD-10-CM | POA: Diagnosis not present

## 2023-10-27 DIAGNOSIS — M6281 Muscle weakness (generalized): Secondary | ICD-10-CM | POA: Diagnosis not present

## 2023-10-27 DIAGNOSIS — M25512 Pain in left shoulder: Secondary | ICD-10-CM

## 2023-10-27 DIAGNOSIS — G8929 Other chronic pain: Secondary | ICD-10-CM

## 2023-10-27 DIAGNOSIS — R293 Abnormal posture: Secondary | ICD-10-CM

## 2023-10-27 DIAGNOSIS — M542 Cervicalgia: Secondary | ICD-10-CM

## 2023-10-27 NOTE — Therapy (Signed)
OUTPATIENT PHYSICAL THERAPY TREATMENT   Patient Name: Ruth Davis MRN: [id number] DOB:[birth year], 68 y.o., female Today's Date: 10/27/2023  END OF SESSION:  PT End of Session - 10/27/23 0924     Visit Number 4    Number of Visits 20    Date for PT Re-Evaluation 12/17/23    Authorization - Visit Number 4    Progress Note Due on Visit 10    PT Start Time 0845    PT Stop Time 0925    PT Time Calculation (min) 40 min    Activity Tolerance Patient tolerated treatment well    Behavior During Therapy [facility] for tasks assessed/performed                Past Medical History:  Diagnosis Date   Allergic rhinitis    Allergy    Essential hypertension, benign    High cholesterol    Past Surgical History:  Procedure Laterality Date   REDUCTION MAMMAPLASTY Bilateral 2008   Patient Active Problem List   Diagnosis Date Noted   Bilateral shoulder pain 10/03/2023   Myalgia 09/28/2023   COVID 03/26/2023   Acute non-recurrent maxillary sinusitis 03/26/2023   Essential hypertension, benign 10/17/2022   Other abnormal glucose 10/17/2022   Vitamin D deficiency disease 10/17/2022   Class 1 obesity due to excess calories without serious comorbidity with body mass index (BMI) of 32.0 to 32.9 in adult 10/17/2022   Pure hypercholesterolemia 09/10/2018   Abnormal thyroid blood test 09/10/2018    PCP: Dorothyann Peng MD  REFERRING PROVIDER: Persons, [place], [place]  REFERRING DIAG: [phone] (ICD-10-CM) - Chronic left shoulder pain M25.511,G89.29 (ICD-10-CM) - Chronic right shoulder pain  THERAPY DIAG:  Cervicalgia  Chronic left shoulder pain  Chronic right shoulder pain  Muscle weakness (generalized)  Abnormal posture  Rationale for Evaluation and Treatment: Rehabilitation  ONSET DATE: Several months, fall off ladder in 08/2023.   SUBJECTIVE:                                                                                                                                                                                                          SUBJECTIVE STATEMENT: Pt arriving today with 7/10 pain in bilateral shoulders more so the right. Pt stating she was getting better, but her pain has returned during the day where previously it was more at night.    PERTINENT HISTORY:  See above.   PAIN:  NPRS scale: 7/10 Pain location: bilateral shoulder/UE, neck  Rt shoulder > Left Pain description: throbbing complaints, pressure, achy Aggravating factors: travel and  pulling/carrying luggage Relieving factors: Medicine  PRECAUTIONS: None  RED FLAGS: None observed  WEIGHT BEARING RESTRICTIONS: No  FALLS:  Has patient fallen in last 6 months? 1 fall without fear of falling.   LIVING ENVIRONMENT: Lives in: House/apartment  OCCUPATION: Desk based work  PLOF: Independent, 30 mins aerobic exercise, walking.    PATIENT GOALS: Reduce pain   OBJECTIVE:   PATIENT SURVEYS:  10/08/2023 FOTO intake: 70   predicted:  73  COGNITION: 10/08/2023 Overall cognitive status: Within functional limits for tasks assessed  SENSATION: 10/08/2023 No specific limitations.   POSTURE:  10/08/2023 Rounded shoulders, reduced cervical distraction  PALPATION: 10/08/2023 Trigger points in infraspinatus bilaterally    CERVICAL ROM:   ROM AROM (deg) 10/08/2023 AROM 10/20/2023  Flexion 40   Extension 54   Right lateral flexion    Left lateral flexion    Right rotation 80 80  Left rotation 62 75   (Blank rows = not tested)  UPPER EXTREMITY ROM:   ROM Right 10/08/2023 Left 10/08/2023  Shoulder flexion    Shoulder extension    Shoulder abduction    Shoulder adduction    Shoulder extension    Shoulder internal rotation    Shoulder external rotation 70 PROM in supine 45 deg abduction 85 PROM in supine 45 deg abduction  Elbow flexion    Elbow extension    Wrist flexion    Wrist extension    Wrist ulnar deviation    Wrist radial deviation    Wrist pronation     Wrist supination     (Blank rows = not tested)  UPPER EXTREMITY MMT:  MMT Right 10/08/2023 Left 10/08/2023 Right 10/20/2023 Left 10/20/2023  Shoulder flexion 4/5 c pain 5/5 4+/5 5/5  Shoulder extension      Shoulder abduction 4/5 c pain 5/5 4+/5 5/5  Shoulder adduction      Shoulder extension      Shoulder internal rotation 5/5 5/5 5/5 5/5  Shoulder external rotation 4+/5 5/5 5/5 5/5  Middle trapezius      Lower trapezius      Elbow flexion 5/5 5/5    Elbow extension 5/5 5/5    Wrist flexion      Wrist extension      Wrist ulnar deviation      Wrist radial deviation      Wrist pronation      Wrist supination      Grip strength       (Blank rows = not tested)  SPECIAL TESTS:  10/15/2023 Radial ULTT Lt(+) Median ULTT Lt (-)  10/08/2023 (-) Spurlings compression, distraction.   FUNCTIONAL TESTS:  10/08/2023 No specific testing.  TODAY'S TREATMENT:                                                                                                       DATE: 10/20/2023 Therex: UBE Level 3 x 3 minutes each direction Standing shoulder ER c green TB 2 x 15 bil  Standing Rows: green TB 2 x 15 c 3 sec eccentric hold Standing shoulder flexion stretch with ball rolls x 5 holding 3-5 sec Standing cross arm stretch: x 2 holding 20 sec bil  Wall posture correction: c cervical and scapular retraction x 10 holding 5 sec (Median nerve flossing 2 options added to pt's HEP)  Manual:  Medial nerve flossing in supine Percussion to bil Upper Trap, levator and rhomboids in sitting       TODAY'S TREATMENT:                                                                                                       DATE: 10/20/2023 Therex: UBE fwd backward level 2.5 Standing GTB ER with towel under arm bilaterally 2 x  10 with slow control focus  Standing GTB rows/extension 2x15ea bilaterally Standing cross arm stretch 15 sec x 5 bilaterally Supine horizontal green band abduction bilateral 2 x 15 Supine cervical retraction 5 sec hold x 10 Supine scapular retraction 5 sec hold x 10   Manual Median nerve flossing with arm towards side in supine.   TODAY'S TREATMENT:                                                                                                       DATE: 10/15/2023 Therex: UBE fwd backward level 2 Standing GTB ER walkout isometric with towel under arm 10x5sec hold ea side Standing GTB rows/extension 2x15ea bilaterally Supine chin tucks 10x5sec; vc for proper form Supine shoulder retractions Supine posterior capsule stretch of Lt shoulder 2x20sec  TODAY'S TREATMENT:  DATE: 10/08/2023 Therex:    HEP instruction/performance c cues for techniques, handout provided.  Trial set performed of each for comprehension and symptom assessment.  See below for exercise list  PATIENT EDUCATION:  10/08/2023 Education details: HEP, POC Person educated: Patient Education method: Explanation, Demonstration, Verbal cues, and Handouts Education comprehension: verbalized understanding, returned demonstration, and verbal cues required  HOME EXERCISE PROGRAM: Access Code: [email or web address] URL: [email or web address] Date: 10/27/2023 Prepared by: Narda Amber  Exercises - Supine Deep Neck Flexor Training  - 2 x daily - 7 x weekly - 1 sets - 10 reps - 5 hold - Cervical Retraction at Wall  - 2 x daily - 7 x weekly - 1 sets - 10 reps - 5 hold - Seated Scapular Retraction  - 3-5 x daily - 7 x weekly - 1 sets - 10 reps - 3-5 hold - Standing Shoulder Posterior Capsule Stretch (Mirrored)  - 2 x daily - 7 x weekly - 1 sets - 5 reps - 15-30 hold - Shoulder External Rotation and Scapular Retraction  with Resistance  - 1 x daily - 7 x weekly - 1-2 sets - 10-15 reps - Standing Bilateral Low Shoulder Row with Anchored Resistance  - 1 x daily - 7 x weekly - 1-2 sets - 10-15 reps - Shoulder Extension with Resistance  - 1 x daily - 7 x weekly - 1-2 sets - 10-15 reps - Shoulder External Rotation with Anchored Resistance  - 2 x daily - 7 x weekly - 3 sets - 10 reps - Standing Median Nerve Glide  - 1 x daily - 7 x weekly - 5-10 reps - Median Nerve Flossing - Tray  - 1 x daily - 7 x weekly - 5-10 reps  ASSESSMENT:  CLINICAL IMPRESSION: Pt arriving in more pain this visit compared to last week. Pt unable to pin point specific activity that may have caused pain to worsen. Pt able to tolerate all exercises well. Pt reporting decreased pain at end of session following manual therapy. Recommending continued skilled PT for progression of strengthening for shoulder and periscapular musculature.    OBJECTIVE IMPAIRMENTS: decreased activity tolerance, decreased endurance, decreased mobility, decreased ROM, decreased strength, increased fascial restrictions, impaired perceived functional ability, impaired flexibility, impaired UE functional use, improper body mechanics, postural dysfunction, and pain.   ACTIVITY LIMITATIONS: carrying, lifting, sitting, sleeping, and reach over head  PARTICIPATION LIMITATIONS: interpersonal relationship, community activity, and occupation  PERSONAL FACTORS:  no specific factors today  are also affecting patient's functional outcome.   REHAB POTENTIAL: Good  CLINICAL DECISION MAKING: Stable/uncomplicated  EVALUATION COMPLEXITY: Low   GOALS: Goals reviewed with patient? Yes  SHORT TERM GOALS: (target date for Short term goals are 3 weeks 10/29/2023)  1.Patient will demonstrate independent use of home exercise program to maintain progress from in clinic treatments. Goal status: MET 10/27/23   LONG TERM GOALS: (target dates for all long term goals are 10 weeks   12/17/2023 )   1. Patient will demonstrate/report pain at worst less than or equal to 2/10 to facilitate minimal limitation in daily activity secondary to pain symptoms. Goal status: on going 10/20/2023   2. Patient will demonstrate independent use of home exercise program to facilitate ability to maintain/progress functional gains from skilled physical therapy services. Goal status: on going 10/20/2023   3. Patient will demonstrate FOTO outcome > or = 73 % to indicate reduced disability due to condition. Goal status: on going 10/20/2023   4.  Patient will demonstrate cervical  AROM WFL s symptoms to facilitate usual head movements for daily activity including driving, self care.   Goal status: on going 10/20/2023   5.  Patient will demonstrate bilateral shoulder MMT 5/5 throughout to facilitate strength at [facility] for daily activity.   Goal status: on going 10/20/2023   6.  Patient will demonstrate ability to sleep s restriction due to symptoms.  Goal status: on going 10/20/2023    PLAN:  PT FREQUENCY: 1-2x/week  PT DURATION: 10 weeks  Can include 16109- PT Re-evaluation, 97110-Therapeutic exercises, 97530- Therapeutic activity, 97112- Neuromuscular re-education, 97535- Self Care, 97140- Manual therapy, [phone]- Gait training, [phone]- Orthotic Fit/training, [phone]- Canalith repositioning, [id number]- Aquatic Therapy, 97014- Electrical stimulation (unattended), [id number]- Electrical stimulation (manual), [id number]- Vasopneumatic device, [id number]- Ultrasound, [id number]- Traction (mechanical), [id number]- Ionotophoresis 4mg /ml Dexamethasone, Patient/Family education, Balance training, Stair training, Taping, Dry Needling, Joint mobilization, Joint manipulation, Spinal manipulation, Spinal mobilization, Scar mobilization, Vestibular training, Visual/preceptual remediation/compensation, DME instructions, Cryotherapy, and Moist heat.  All performed as medically necessary.  All included unless contraindicated  PLAN FOR NEXT  SESSION:  FOTO reassessment possible if symptoms are still improving. Progress pt's shoulder stability and strength as tolerated  Narda Amber, PT, MPT 10/27/23 9:24 AM   10/27/23  9:24 AM

## 2023-11-05 ENCOUNTER — Ambulatory Visit (INDEPENDENT_AMBULATORY_CARE_PROVIDER_SITE_OTHER): Payer: Medicare Other | Admitting: Rehabilitative and Restorative Service Providers"

## 2023-11-05 ENCOUNTER — Encounter: Payer: Self-pay | Admitting: Rehabilitative and Restorative Service Providers"

## 2023-11-05 DIAGNOSIS — G8929 Other chronic pain: Secondary | ICD-10-CM

## 2023-11-05 DIAGNOSIS — M25511 Pain in right shoulder: Secondary | ICD-10-CM | POA: Diagnosis not present

## 2023-11-05 DIAGNOSIS — M25512 Pain in left shoulder: Secondary | ICD-10-CM | POA: Diagnosis not present

## 2023-11-05 DIAGNOSIS — M542 Cervicalgia: Secondary | ICD-10-CM | POA: Diagnosis not present

## 2023-11-05 DIAGNOSIS — M6281 Muscle weakness (generalized): Secondary | ICD-10-CM | POA: Diagnosis not present

## 2023-11-05 DIAGNOSIS — R293 Abnormal posture: Secondary | ICD-10-CM

## 2023-11-05 NOTE — Therapy (Addendum)
 OUTPATIENT PHYSICAL THERAPY TREATMENT / DISCHARGE   Patient Name: Ruth Davis MRN: [id number] DOB:13-Nov-1955, 68 y.o., female Today's Date: 11/05/2023  END OF SESSION:  PT End of Session - 11/05/23 0923     Visit Number 5    Number of Visits 20    Date for PT Re-Evaluation 12/17/23    Authorization Type Medicare and BCBS    Authorization - Visit Number 5    Progress Note Due on Visit 10    PT Start Time 0922    PT Stop Time 1002    PT Time Calculation (min) 40 min    Activity Tolerance Patient tolerated treatment well    Behavior During Therapy [facility] for tasks assessed/performed                 Past Medical History:  Diagnosis Date   Allergic rhinitis    Allergy    Essential hypertension, benign    High cholesterol    Past Surgical History:  Procedure Laterality Date   REDUCTION MAMMAPLASTY Bilateral 2008   Patient Active Problem List   Diagnosis Date Noted   Bilateral shoulder pain 10/03/2023   Myalgia 09/28/2023   COVID 03/26/2023   Acute non-recurrent maxillary sinusitis 03/26/2023   Essential hypertension, benign 10/17/2022   Other abnormal glucose 10/17/2022   Vitamin D  deficiency disease 10/17/2022   Class 1 obesity due to excess calories without serious comorbidity with body mass index (BMI) of 32.0 to 32.9 in adult 10/17/2022   Pure hypercholesterolemia 09/10/2018   Abnormal thyroid blood test 09/10/2018    PCP: Jarold Medici MD  REFERRING PROVIDER: Persons, Ronal Dragon, [place]  REFERRING DIAG: [phone] (ICD-10-CM) - Chronic left shoulder pain M25.511,G89.29 (ICD-10-CM) - Chronic right shoulder pain  THERAPY DIAG:  Cervicalgia  Chronic left shoulder pain  Chronic right shoulder pain  Muscle weakness (generalized)  Abnormal posture  Rationale for Evaluation and Treatment: Rehabilitation  ONSET DATE: Several months, fall off ladder in 08/2023.   SUBJECTIVE:                                                                                                                                                                                                          SUBJECTIVE STATEMENT: Pt indicated not sure what has caused more pain in last week or so.  Pt indicated having some good times during the day but has noted complaints at night.  Pt indicated some tightness in Lt arm as well as upper shoulder pain.  Pt indicated massage device did feel good.  Pt indicated traveling has happened.  Pt indicated she felt like she has been fairly consistent c HEP.   Global rating of change somewhat better +3.   PERTINENT HISTORY:  See above.   PAIN:  NPRS scale: 7/10 Pain location: bilateral shoulder/UE, neck  Rt shoulder > Left Pain description: throbbing complaints, pressure, achy Aggravating factors: travel and pulling/carrying luggage Relieving factors: Medicine  PRECAUTIONS: None  RED FLAGS: None observed  WEIGHT BEARING RESTRICTIONS: No  FALLS:  Has patient fallen in last 6 months? 1 fall without fear of falling.   LIVING ENVIRONMENT: Lives in: House/apartment  OCCUPATION: Desk based work  PLOF: Independent, 30 mins aerobic exercise, walking.    PATIENT GOALS: Reduce pain   OBJECTIVE:   PATIENT SURVEYS:  10/08/2023 FOTO intake: 70   predicted:  73  COGNITION: 10/08/2023 Overall cognitive status: Within functional limits for tasks assessed  SENSATION: 10/08/2023 No specific limitations.   POSTURE:  10/08/2023 Rounded shoulders, reduced cervical distraction  PALPATION: 10/08/2023 Trigger points in infraspinatus bilaterally    CERVICAL ROM:   ROM AROM (deg) 10/08/2023 AROM 10/20/2023 AROM 11/05/2023  Flexion 40  40  Extension 54  55  Right lateral flexion     Left lateral flexion     Right rotation 80 80 85  Left rotation 62 75 80   (Blank rows = not tested)  UPPER EXTREMITY ROM:   ROM Right 10/08/2023 Left 10/08/2023  Shoulder flexion    Shoulder extension    Shoulder abduction    Shoulder  adduction    Shoulder extension    Shoulder internal rotation    Shoulder external rotation 70 PROM in supine 45 deg abduction 85 PROM in supine 45 deg abduction  Elbow flexion    Elbow extension    Wrist flexion    Wrist extension    Wrist ulnar deviation    Wrist radial deviation    Wrist pronation    Wrist supination     (Blank rows = not tested)  UPPER EXTREMITY MMT:  MMT Right 10/08/2023 Left 10/08/2023 Right 10/20/2023 Left 10/20/2023  Shoulder flexion 4/5 c pain 5/5 4+/5 5/5  Shoulder extension      Shoulder abduction 4/5 c pain 5/5 4+/5 5/5  Shoulder adduction      Shoulder extension      Shoulder internal rotation 5/5 5/5 5/5 5/5  Shoulder external rotation 4+/5 5/5 5/5 5/5  Middle trapezius      Lower trapezius      Elbow flexion 5/5 5/5    Elbow extension 5/5 5/5    Wrist flexion      Wrist extension      Wrist ulnar deviation      Wrist radial deviation      Wrist pronation      Wrist supination      Grip strength       (Blank rows = not tested)  SPECIAL TESTS:  10/15/2023 Radial ULTT Lt(+) Median ULTT Lt (-)  10/08/2023 (-) Spurlings compression, distraction.   FUNCTIONAL TESTS:  10/08/2023 No specific testing.  TODAY'S TREATMENT:                                                                                                       DATE: 11/05/2023 Therex: UBE Level 3 x 3 minutes each direction - 1 min rest between directions Doorway chest stretch 15 sec x 5   Neuro Re-ed (neural recruitment for postural support musculature, scapular positioning awareness) Blue band rows 2 x 15 c scap retraction focus Blue band GH ext 2 x 15    Self Care: Education discussion about use of heat or ice for short term symptom relief as desired.  Detailed avoiding burns/frostbite as side effects.  Also  reviewed option for percussive device purchase for long term care if desired.   Manual:  Percussion to bil Upper Trap, levator and rhomboids in sitting.  TODAY'S TREATMENT:                                                                                                       DATE: 10/27/2023 Therex: UBE Level 3 x 3 minutes each direction Standing shoulder ER c green TB 2 x 15 bil  Standing Rows: green TB 2 x 15 c 3 sec eccentric hold Standing shoulder flexion stretch with ball rolls x 5 holding 3-5 sec Standing cross arm stretch: x 2 holding 20 sec bil  Wall posture correction: c cervical and scapular retraction x 10 holding 5 sec (Median nerve flossing 2 options added to pt's HEP)  Manual:  Medial nerve flossing in supine Percussion to bil Upper Trap, levator and rhomboids in sitting  TODAY'S TREATMENT:                                                                                                       DATE: 10/20/2023 Therex: UBE fwd backward level 2.5 Standing GTB ER with towel under arm bilaterally 2 x 10 with slow control focus  Standing GTB rows/extension 2x15ea bilaterally Standing cross arm stretch 15 sec x 5 bilaterally Supine horizontal green band abduction bilateral 2 x 15 Supine cervical retraction 5 sec hold x 10 Supine scapular retraction 5 sec hold x 10   Manual Median nerve flossing with arm towards side in supine.  TODAY'S TREATMENT:                                                                                                       DATE: 10/15/2023 Therex: UBE fwd backward level 2 Standing GTB ER walkout isometric with towel under arm 10x5sec hold ea side Standing GTB rows/extension 2x15ea bilaterally Supine chin tucks 10x5sec; vc for proper form Supine shoulder retractions Supine posterior capsule stretch of Lt shoulder 2x20sec  TODAY'S TREATMENT:                                                                                                        DATE: 10/08/2023 Therex:    HEP instruction/performance c cues for techniques, handout provided.  Trial set performed of each for comprehension and symptom assessment.  See below for exercise list  PATIENT EDUCATION:  10/08/2023 Education details: HEP, POC Person educated: Patient Education method: Explanation, Demonstration, Verbal cues, and Handouts Education comprehension: verbalized understanding, returned demonstration, and verbal cues required  HOME EXERCISE PROGRAM: Access Code: [email or web address] URL: [email or web address] Date: 10/27/2023 Prepared by: Delon Lunger  Exercises - Supine Deep Neck Flexor Training  - 2 x daily - 7 x weekly - 1 sets - 10 reps - 5 hold - Cervical Retraction at Wall  - 2 x daily - 7 x weekly - 1 sets - 10 reps - 5 hold - Seated Scapular Retraction  - 3-5 x daily - 7 x weekly - 1 sets - 10 reps - 3-5 hold - Standing Shoulder Posterior Capsule Stretch (Mirrored)  - 2 x daily - 7 x weekly - 1 sets - 5 reps - 15-30 hold - Shoulder External Rotation and Scapular Retraction with Resistance  - 1 x daily - 7 x weekly - 1-2 sets - 10-15 reps - Standing Bilateral Low Shoulder Row with Anchored Resistance  - 1 x daily - 7 x weekly - 1-2 sets - 10-15 reps - Shoulder Extension with Resistance  - 1 x daily - 7 x weekly - 1-2 sets - 10-15 reps - Shoulder External Rotation with Anchored Resistance  - 2 x daily - 7 x weekly - 3 sets - 10 reps - Standing Median Nerve Glide  - 1 x daily - 7 x weekly - 5-10 reps - Median Nerve Flossing - Tray  - 1 x daily - 7 x weekly - 5-10 reps  ASSESSMENT:  CLINICAL IMPRESSION: Despite continued complaints of symptoms noted at times, specifically at night, global rating of change was noted at +3 and general reduced severity of symptoms in day was noted.  Pt may continue to benefit from skilled PT services to continue  to improve postural musculature recruitment and strength for improved tolerance for daily activity as well  as myofascial treatment options as necessary.    OBJECTIVE IMPAIRMENTS: decreased activity tolerance, decreased endurance, decreased mobility, decreased ROM, decreased strength, increased fascial restrictions, impaired perceived functional ability, impaired flexibility, impaired UE functional use, improper body mechanics, postural dysfunction, and pain.   ACTIVITY LIMITATIONS: carrying, lifting, sitting, sleeping, and reach over head  PARTICIPATION LIMITATIONS: interpersonal relationship, community activity, and occupation  PERSONAL FACTORS:  no specific factors today  are also affecting patient's functional outcome.   REHAB POTENTIAL: Good  CLINICAL DECISION MAKING: Stable/uncomplicated  EVALUATION COMPLEXITY: Low   GOALS: Goals reviewed with patient? Yes  SHORT TERM GOALS: (target date for Short term goals are 3 weeks 10/29/2023)  1.Patient will demonstrate independent use of home exercise program to maintain progress from in clinic treatments. Goal status: MET 10/27/23   LONG TERM GOALS: (target dates for all long term goals are 10 weeks  12/17/2023 )   1. Patient will demonstrate/report pain at worst less than or equal to 2/10 to facilitate minimal limitation in daily activity secondary to pain symptoms. Goal status: on going 10/20/2023   2. Patient will demonstrate independent use of home exercise program to facilitate ability to maintain/progress functional gains from skilled physical therapy services. Goal status: on going 10/20/2023   3. Patient will demonstrate FOTO outcome > or = 73 % to indicate reduced disability due to condition. Goal status: on going 10/20/2023   4.  Patient will demonstrate cervical AROM WFL s symptoms to facilitate usual head movements for daily activity including driving, self care.   Goal status: on going 10/20/2023   5.  Patient will demonstrate bilateral shoulder MMT 5/5 throughout to facilitate strength at [facility] for daily activity.   Goal  status: on going 10/20/2023   6.  Patient will demonstrate ability to sleep s restriction due to symptoms.  Goal status: on going 10/20/2023    PLAN:  PT FREQUENCY: 1-2x/week  PT DURATION: 10 weeks  Can include 02853- PT Re-evaluation, 97110-Therapeutic exercises, 97530- Therapeutic activity, 97112- Neuromuscular re-education, 97535- Self Care, 97140- Manual therapy, [phone]- Gait training, [phone]- Orthotic Fit/training, [phone]- Canalith repositioning, [id number]- Aquatic Therapy, 97014- Electrical stimulation (unattended), [id number]- Electrical stimulation (manual), [id number]- Vasopneumatic device, [id number]- Ultrasound, [id number]- Traction (mechanical), [id number]- Ionotophoresis 4mg /ml Dexamethasone, Patient/Family education, Balance training, Stair training, Taping, Dry Needling, Joint mobilization, Joint manipulation, Spinal manipulation, Spinal mobilization, Scar mobilization, Vestibular training, Visual/preceptual remediation/compensation, DME instructions, Cryotherapy, and Moist heat.  All performed as medically necessary.  All included unless contraindicated  PLAN FOR NEXT SESSION:  Recheck UE strength, FOTO due to being 30 days.   Ozell Silvan, PT, DPT, OCS, ATC 11/05/23  9:58 AM   PHYSICAL THERAPY DISCHARGE SUMMARY  Visits from Start of Care: 5  Current functional level related to goals / functional outcomes: See note   Remaining deficits: See note   Education / Equipment: HEP  Patient goals were partially met. Patient is being discharged due to not returning since the last visit.  Ozell Silvan, PT, DPT, OCS, ATC 01/14/24  8:13 AM

## 2023-11-06 ENCOUNTER — Ambulatory Visit
Admission: RE | Admit: 2023-11-06 | Discharge: 2023-11-06 | Disposition: A | Discharge status: Home / Self Care | Payer: Medicare Other | Source: Ambulatory Visit | Attending: Internal Medicine | Admitting: Internal Medicine

## 2023-11-06 DIAGNOSIS — Z1231 Encounter for screening mammogram for malignant neoplasm of breast: Secondary | ICD-10-CM

## 2023-11-12 LAB — HM COLONOSCOPY

## 2023-11-14 ENCOUNTER — Ambulatory Visit: Payer: Medicare Other | Admitting: Orthopaedic Surgery

## 2023-11-14 ENCOUNTER — Encounter: Payer: Self-pay | Admitting: Orthopaedic Surgery

## 2023-11-14 ENCOUNTER — Other Ambulatory Visit (INDEPENDENT_AMBULATORY_CARE_PROVIDER_SITE_OTHER): Payer: Medicare Other

## 2023-11-14 DIAGNOSIS — M25511 Pain in right shoulder: Secondary | ICD-10-CM

## 2023-11-14 DIAGNOSIS — G8929 Other chronic pain: Secondary | ICD-10-CM | POA: Diagnosis not present

## 2023-11-14 DIAGNOSIS — M25512 Pain in left shoulder: Secondary | ICD-10-CM | POA: Diagnosis not present

## 2023-11-14 NOTE — Progress Notes (Signed)
Office Visit Note   Patient: Ruth Davis           Date of Birth: Jan 11, 1956           MRN: [id number] Visit Date: 11/14/2023              Requested by: Dorothyann Peng, MD [address] [place],  [place] [address] PCP: Dorothyann Peng, MD   Assessment & Plan: Visit Diagnoses:  1. Chronic right shoulder pain   2. Chronic left shoulder pain     Plan: Impression is resolving bilateral shoulder pain.  At this point, patient is clinically doing well with NSAIDs and physical therapy.  If her symptoms worsen she will follow-up.  Call with concerns or questions.  Follow-Up Instructions: Return if symptoms worsen or fail to improve.   Orders:  Orders Placed This Encounter  Procedures   XR Shoulder Right   No orders of the defined types were placed in this encounter.     Procedures: No procedures performed   Clinical Data: No additional findings.   Subjective: Chief Complaint  Patient presents with   Right Shoulder - Pain, Follow-up   Left Shoulder - Pain, Follow-up   Neck - Pain, Follow-up    HPI patient is a pleasant 68 year old female who comes in today with recurrent bilateral shoulder pain.  She was initially seen by [place] in 2023 with right shoulder pain.  This was treated with meloxicam which improved her symptoms until this past December when she fell off a ladder landing on her right side.  She began having left shoulder pain at that time as well was seen by Clerance Lav.  She has continued meloxicam and has been to physical therapy which has seemed to help.  The right shoulder is now more symptomatic than the left.  The pain is to the anterior and lateral shoulder.  She has subjective weakness.  Pain is worse at night as well as when she is reaching up to grab something.  She denies any paresthesias to either upper extremity.  Review of Systems as detailed in HPI.  All others reviewed and are negative.   Objective: Vital Signs: There were no vitals  taken for this visit.  Physical Exam well-developed well-nourished female no acute distress.  Alert and oriented x 3.  Ortho Exam bilateral shoulder exam: Near full active range of motion in all planes.  Minimally positive empty can test on the right.  Negative empty can test, speeds, O'Brien's on the left.  Full strength throughout.  She is neurovascularly intact distally.  Specialty Comments:  No specialty comments available.  Imaging: XR Shoulder Right Result Date: 11/14/2023 X-rays of the right shoulder show significant degenerative changes to the [facility] joint.  There are no acute or structural abnormalities.    PMFS History: Patient Active Problem List   Diagnosis Date Noted   Bilateral shoulder pain 10/03/2023   Myalgia 09/28/2023   COVID 03/26/2023   Acute non-recurrent maxillary sinusitis 03/26/2023   Essential hypertension, benign 10/17/2022   Other abnormal glucose 10/17/2022   Vitamin D deficiency disease 10/17/2022   Class 1 obesity due to excess calories without serious comorbidity with body mass index (BMI) of 32.0 to 32.9 in adult 10/17/2022   Pure hypercholesterolemia 09/10/2018   Abnormal thyroid blood test 09/10/2018   Past Medical History:  Diagnosis Date   Allergic rhinitis    Allergy    Essential hypertension, benign    High cholesterol     Family History  Problem Relation Age of Onset   Breast cancer Sister    Breast cancer Maternal Grandmother    Alzheimer's disease Mother    Bone cancer Father    Throat cancer Brother    Breast cancer Sister    Colon cancer Sister     Past Surgical History:  Procedure Laterality Date   REDUCTION MAMMAPLASTY Bilateral 2008   Social History   Occupational History   Not on file  Tobacco Use   Smoking status: Never   Smokeless tobacco: Never  Vaping Use   Vaping status: Never Used  Substance and Sexual Activity   Alcohol use: Never   Drug use: Never   Sexual activity: Not on file

## 2023-11-21 ENCOUNTER — Encounter: Payer: Medicare Other | Admitting: Rehabilitative and Restorative Service Providers"

## 2023-11-28 ENCOUNTER — Encounter: Payer: Medicare Other | Admitting: Rehabilitative and Restorative Service Providers"

## 2023-12-05 ENCOUNTER — Encounter: Payer: Medicare Other | Admitting: Rehabilitative and Restorative Service Providers"

## 2023-12-12 ENCOUNTER — Encounter: Payer: Medicare Other | Admitting: Rehabilitative and Restorative Service Providers"

## 2024-01-18 ENCOUNTER — Other Ambulatory Visit: Payer: Self-pay | Admitting: Internal Medicine

## 2024-03-17 ENCOUNTER — Ambulatory Visit: Payer: Medicare Other | Admitting: Internal Medicine

## 2024-03-23 ENCOUNTER — Ambulatory Visit: Admitting: Internal Medicine

## 2024-03-30 ENCOUNTER — Ambulatory Visit (INDEPENDENT_AMBULATORY_CARE_PROVIDER_SITE_OTHER): Admitting: Internal Medicine

## 2024-03-30 VITALS — BP 116/70 | HR 67 | Temp 98.0°F | Ht 63.0 in | Wt 187.0 lb

## 2024-03-30 DIAGNOSIS — E6609 Other obesity due to excess calories: Secondary | ICD-10-CM

## 2024-03-30 DIAGNOSIS — M791 Myalgia, unspecified site: Secondary | ICD-10-CM

## 2024-03-30 DIAGNOSIS — R7309 Other abnormal glucose: Secondary | ICD-10-CM | POA: Diagnosis not present

## 2024-03-30 DIAGNOSIS — E7800 Pure hypercholesterolemia, unspecified: Secondary | ICD-10-CM

## 2024-03-30 DIAGNOSIS — E66811 Obesity, class 1: Secondary | ICD-10-CM

## 2024-03-30 DIAGNOSIS — Z6833 Body mass index (BMI) 33.0-33.9, adult: Secondary | ICD-10-CM

## 2024-03-30 DIAGNOSIS — I10 Essential (primary) hypertension: Secondary | ICD-10-CM | POA: Diagnosis not present

## 2024-03-30 MED ORDER — ATORVASTATIN CALCIUM 80 MG PO TABS: 80.0000 mg | ORAL_TABLET | Freq: Every day | ORAL | 1 refills | Status: AC

## 2024-03-30 MED ORDER — AMLODIPINE BESYLATE 2.5 MG PO TABS: 2.5000 mg | ORAL_TABLET | Freq: Every day | ORAL | 2 refills | Status: DC

## 2024-03-30 NOTE — Progress Notes (Signed)
 I,Victoria T Emmitt, CMA,acting as a Neurosurgeon for Catheryn LOISE Slocumb, MD.,have documented all relevant documentation on the behalf of Catheryn LOISE Slocumb, MD,as directed by  Catheryn LOISE Slocumb, MD while in the presence of Catheryn LOISE Slocumb, MD.  Subjective:  Patient ID: Ruth Davis , female    DOB: Jun 30, 1956 , 68 y.o.   MRN: [id number]  Chief Complaint  Patient presents with   Hypertension    Patient presents today for HTN and CHOL follow up. She has no concerns at this time. She reports compliance with medications. Denies headache, chest pain & sob.   Hyperlipidemia    HPI Discussed the use of AI scribe software for clinical note transcription with the patient, who gave verbal consent to proceed.  History of Present Illness Ruth Davis is a 68 year old female with hypertension who presents for a blood pressure check.  She monitors her blood pressure at home twice a month, with readings typically ranging from 128-134/70 mmHg. Her current medications include amlodipine  2.5 mg daily and atorvastatin  80 mg. She completed a course of Augmentin  a year ago.  She is concerned about her weight, attributing it to stress and overeating. She has recently retired and does not plan to return to work. She engages in physical activity six to seven days a week for 60 to 90 minutes without experiencing muscle aches.  She uses meloxicam  as needed for shoulder pain and Astelin  nasal spray as needed. She experiences no issues with breathing in the heat and usually walks outside early in the morning to avoid high temperatures. She hydrates with water and sometimes consumes fruits like strawberries, cantaloupe, and blueberries after her walks.   Hypertension This is a new problem. The current episode started 1 to 4 weeks ago. The problem has been gradually improving since onset. The problem is controlled. Pertinent negatives include no blurred vision, chest pain or shortness of breath. Past treatments  include calcium  channel blockers. The current treatment provides moderate improvement.  Hyperlipidemia This is a chronic problem. The current episode started more than 1 year ago. The problem is uncontrolled. Recent lipid tests were reviewed and are high. Associated symptoms include myalgias. Pertinent negatives include no chest pain, focal weakness or shortness of breath. She is currently on no antihyperlipidemic treatment.     Past Medical History:  Diagnosis Date   Allergic rhinitis    Allergy    Essential hypertension, benign    High cholesterol      Family History  Problem Relation Age of Onset   Breast cancer Sister    Breast cancer Maternal Grandmother    Alzheimer's disease Mother    Bone cancer Father    Throat cancer Brother    Breast cancer Sister    Colon cancer Sister      Current Outpatient Medications:    albuterol (VENTOLIN HFA) 108 (90 Base) MCG/ACT inhaler, Inhale into the lungs every 6 (six) hours as needed for wheezing or shortness of breath., Disp: , Rfl:    azelastine  (ASTELIN ) 0.1 % nasal spray, Place 1 spray into both nostrils 2 (two) times daily. Use in each nostril as directed, Disp: 30 mL, Rfl: 0   meloxicam  (MOBIC ) 7.5 MG tablet, TAKE 1 TABLET TWICE A DAY AS NEEDED FOR SHOULDER PAIN, Disp: 60 tablet, Rfl: 0   amLODipine  (NORVASC ) 2.5 MG tablet, Take 1 tablet (2.5 mg total) by mouth daily., Disp: 90 tablet, Rfl: 2   atorvastatin  (LIPITOR) 80 MG tablet, Take 1 tablet (80 mg total)  by mouth daily., Disp: 90 tablet, Rfl: 1   Allergies  Allergen Reactions   Acetaminophen Other (See Comments)    Hives/swelling   Fomepizole Other (See Comments)    Hives/swelling     Review of Systems  Constitutional: Negative.   Eyes:  Negative for blurred vision.  Respiratory: Negative.  Negative for shortness of breath.   Cardiovascular: Negative.  Negative for chest pain.  Gastrointestinal: Negative.   Musculoskeletal:  Positive for myalgias.  Neurological:  Negative.  Negative for focal weakness.  Psychiatric/Behavioral: Negative.       Today's Vitals   03/30/24 1423  BP: 116/70  Pulse: 67  Temp: 98 F (36.7 C)  TempSrc: Oral  SpO2: 96%  Weight: 187 lb (84.8 kg)  Height: 5' 3 (1.6 m)   Body mass index is 33.13 kg/m.  Wt Readings from Last 3 Encounters:  03/30/24 187 lb (84.8 kg)  09/17/23 186 lb (84.4 kg)  09/17/23 186 lb (84.4 kg)     Objective:  Physical Exam Vitals and nursing note reviewed.  Constitutional:      Appearance: Normal appearance.  HENT:     Head: Normocephalic and atraumatic.  Eyes:     Extraocular Movements: Extraocular movements intact.  Cardiovascular:     Rate and Rhythm: Normal rate and regular rhythm.     Heart sounds: Normal heart sounds.  Pulmonary:     Effort: Pulmonary effort is normal.     Breath sounds: Normal breath sounds.  Musculoskeletal:     Cervical back: Normal range of motion.  Skin:    General: Skin is warm.  Neurological:     General: No focal deficit present.     Mental Status: She is alert.  Psychiatric:        Mood and Affect: Mood normal.        Behavior: Behavior normal.      Assessment And Plan:  Essential hypertension, benign Assessment & Plan: Chronic, well controlled. She will c/w amlodipine  2.5mg  daily. She is encouraged to follow a low sodium diet and c/w regular exercise regimen. F/u 6 months.   Orders: -     CBC -     CMP14+EGFR  Pure hypercholesterolemia Assessment & Plan: Chronic, she will c/w atorvastatin  80mg  daily. She is encouraged to follow a heart healthy diet. She will rto in four to six months for re-evaluation. She is encouraged to follow a heart healthy lifestyle.     Orders: -     CBC -     CMP14+EGFR -     Lipid panel  Other abnormal glucose Assessment & Plan: Previous labs reviewed, her A1c has been elevated in the past. I will check an A1c today. Reminded to avoid refined sugars including sugary drinks/foods and processed meats  including bacon, sausages and deli meats.    Orders: -     CMP14+EGFR -     Hemoglobin A1c  Class 1 obesity due to excess calories with serious comorbidity and body mass index (BMI) of 33.0 to 33.9 in adult Assessment & Plan: She is encouraged to strive for BMI less than 30 to decrease cardiac risk. Advised to aim for at least 150 minutes of exercise per week.    Other orders -     amLODIPine  Besylate; Take 1 tablet (2.5 mg total) by mouth daily.  Dispense: 90 tablet; Refill: 2 -     Atorvastatin  Calcium ; Take 1 tablet (80 mg total) by mouth daily.  Dispense: 90 tablet; Refill: 1  General Health Maintenance Physically active. Discussed hydration and electrolyte intake during exercise. Recommended biennial physical exam. - Order labs: liver, kidney function, blood count, prediabetes status. - Schedule physical exam for July next year. - Encourage hydration with electrolytes during prolonged outdoor activities. - Discussed importance of fruit intake post-exercise.  Return for 1 year physical.  Patient was given opportunity to ask questions. Patient verbalized understanding of the plan and was able to repeat key elements of the plan. All questions were answered to their satisfaction.   I, Catheryn LOISE Slocumb, MD, have reviewed all documentation for this visit. The documentation on 03/30/24 for the exam, diagnosis, procedures, and orders are all accurate and complete.   IF YOU HAVE BEEN REFERRED TO A SPECIALIST, IT MAY TAKE 1-2 WEEKS TO SCHEDULE/PROCESS THE REFERRAL. IF YOU HAVE NOT HEARD FROM US /SPECIALIST IN TWO WEEKS, PLEASE GIVE US  A CALL AT [phone].   THE PATIENT IS ENCOURAGED TO PRACTICE SOCIAL DISTANCING DUE TO THE COVID-19 PANDEMIC.

## 2024-03-30 NOTE — Patient Instructions (Signed)
 Hypertension, Adult Hypertension is another name for high blood pressure. High blood pressure forces your heart to work harder to pump blood. This can cause problems over time. There are two numbers in a blood pressure reading. There is a top number (systolic) over a bottom number (diastolic). It is best to have a blood pressure that is below 120/80. What are the causes? The cause of this condition is not known. Some other conditions can lead to high blood pressure. What increases the risk? Some lifestyle factors can make you more likely to develop high blood pressure: Smoking. Not getting enough exercise or physical activity. Being overweight. Having too much fat, sugar, calories, or salt (sodium) in your diet. Drinking too much alcohol. Other risk factors include: Having any of these conditions: Heart disease. Diabetes. High cholesterol. Kidney disease. Obstructive sleep apnea. Having a family history of high blood pressure and high cholesterol. Age. The risk increases with age. Stress. What are the signs or symptoms? High blood pressure may not cause symptoms. Very high blood pressure (hypertensive crisis) may cause: Headache. Fast or uneven heartbeats (palpitations). Shortness of breath. Nosebleed. Vomiting or feeling like you may vomit (nauseous). Changes in how you see. Very bad chest pain. Feeling dizzy. Seizures. How is this treated? This condition is treated by making healthy lifestyle changes, such as: Eating healthy foods. Exercising more. Drinking less alcohol. Your doctor may prescribe medicine if lifestyle changes do not help enough and if: Your top number is above 130. Your bottom number is above 80. Your personal target blood pressure may vary. Follow these instructions at home: Eating and drinking  If told, follow the DASH eating plan. To follow this plan: Fill one half of your plate at each meal with fruits and vegetables. Fill one fourth of your plate  at each meal with whole grains. Whole grains include whole-wheat pasta, brown rice, and whole-grain bread. Eat or drink low-fat dairy products, such as skim milk or low-fat yogurt. Fill one fourth of your plate at each meal with low-fat (lean) proteins. Low-fat proteins include fish, chicken without skin, eggs, beans, and tofu. Avoid fatty meat, cured and processed meat, or chicken with skin. Avoid pre-made or processed food. Limit the amount of salt in your diet to less than 1,500 mg each day. Do not drink alcohol if: Your doctor tells you not to drink. You are pregnant, may be pregnant, or are planning to become pregnant. If you drink alcohol: Limit how much you have to: 0-1 drink a day for women. 0-2 drinks a day for men. Know how much alcohol is in your drink. In the [place] one drink equals one 12 oz bottle of beer (355 mL), one 5 oz glass of wine (148 mL), or one 1 oz glass of hard liquor (44 mL). Lifestyle  Work with your doctor to stay at a healthy weight or to lose weight. Ask your doctor what the best weight is for you. Get at least 30 minutes of exercise that causes your heart to beat faster (aerobic exercise) most days of the week. This may include walking, swimming, or biking. Get at least 30 minutes of exercise that strengthens your muscles (resistance exercise) at least 3 days a week. This may include lifting weights or doing Pilates. Do not smoke or use any products that contain nicotine or tobacco. If you need help quitting, ask your doctor. Check your blood pressure at home as told by your doctor. Keep all follow-up visits. Medicines Take over-the-counter and prescription medicines  only as told by your doctor. Follow directions carefully. Do not skip doses of blood pressure medicine. The medicine does not work as well if you skip doses. Skipping doses also puts you at risk for problems. Ask your doctor about side effects or reactions to medicines that you should watch  for. Contact a doctor if: You think you are having a reaction to the medicine you are taking. You have headaches that keep coming back. You feel dizzy. You have swelling in your ankles. You have trouble with your vision. Get help right away if: You get a very bad headache. You start to feel mixed up (confused). You feel weak or numb. You feel faint. You have very bad pain in your: Chest. Belly (abdomen). You vomit more than once. You have trouble breathing. These symptoms may be an emergency. Get help right away. Call 911. Do not wait to see if the symptoms will go away. Do not drive yourself to the hospital. Summary Hypertension is another name for high blood pressure. High blood pressure forces your heart to work harder to pump blood. For most people, a normal blood pressure is less than 120/80. Making healthy choices can help lower blood pressure. If your blood pressure does not get lower with healthy choices, you may need to take medicine. This information is not intended to replace advice given to you by your health care provider. Make sure you discuss any questions you have with your health care provider. Document Revised: 07/05/2021 Document Reviewed: 07/05/2021 Elsevier Patient Education  2024 [employer].

## 2024-03-31 ENCOUNTER — Encounter: Payer: Self-pay | Admitting: Internal Medicine

## 2024-03-31 LAB — CMP14+EGFR
ALT: 27 IU/L (ref 0–32)
AST: 31 IU/L (ref 0–40)
Albumin: 3.9 g/dL (ref 3.9–4.9)
Alkaline Phosphatase: 62 IU/L (ref 44–121)
BUN/Creatinine Ratio: 20 (ref 12–28)
BUN: 16 mg/dL (ref 8–27)
Bilirubin Total: 0.4 mg/dL (ref 0.0–1.2)
CO2: 24 mmol/L (ref 20–29)
Calcium: 8.9 mg/dL (ref 8.7–10.3)
Chloride: 106 mmol/L (ref 96–106)
Creatinine, Ser: 0.8 mg/dL (ref 0.57–1.00)
Globulin, Total: 3 g/dL (ref 1.5–4.5)
Glucose: 83 mg/dL (ref 70–99)
Potassium: 3.9 mmol/L (ref 3.5–5.2)
Sodium: 144 mmol/L (ref 134–144)
Total Protein: 6.9 g/dL (ref 6.0–8.5)
eGFR: 81 mL/min/{1.73_m2} (ref >59)

## 2024-03-31 LAB — LIPID PANEL
Chol/HDL Ratio: 2.6 ratio (ref 0.0–4.4)
Cholesterol, Total: 145 mg/dL (ref 100–199)
HDL: 55 mg/dL (ref >39)
LDL Chol Calc (NIH): 78 mg/dL (ref 0–99)
Triglycerides: 56 mg/dL (ref 0–149)
VLDL Cholesterol Cal: 12 mg/dL (ref 5–40)

## 2024-03-31 LAB — CBC
Hematocrit: 39 % (ref 34.0–46.6)
Hemoglobin: 12.8 g/dL (ref 11.1–15.9)
MCH: 32.2 pg (ref 26.6–33.0)
MCHC: 32.8 g/dL (ref 31.5–35.7)
MCV: 98 fL — ABNORMAL HIGH (ref 79–97)
Platelets: 240 10*3/uL (ref 150–450)
RBC: 3.97 x10E6/uL (ref 3.77–5.28)
RDW: 12.8 % (ref 11.7–15.4)
WBC: 3.7 10*3/uL (ref 3.4–10.8)

## 2024-03-31 LAB — HEMOGLOBIN A1C
Est. average glucose Bld gHb Est-mCnc: 126 mg/dL
Hgb A1c MFr Bld: 6 % — ABNORMAL HIGH (ref 4.8–5.6)

## 2024-04-03 ENCOUNTER — Ambulatory Visit: Payer: Self-pay | Admitting: Internal Medicine

## 2024-04-03 NOTE — Assessment & Plan Note (Addendum)
 Chronic, she will c/w atorvastatin  80mg  daily. She is encouraged to follow a heart healthy diet. She will rto in four to six months for re-evaluation. She is encouraged to follow a heart healthy lifestyle.

## 2024-04-03 NOTE — Assessment & Plan Note (Signed)
Chronic, well controlled. She will c/w amlodipine 2.5mg  daily. She is encouraged to follow a low sodium diet and c/w regular exercise regimen. F/u 6 months.

## 2024-04-03 NOTE — Assessment & Plan Note (Signed)
 She is encouraged to strive for BMI less than 30 to decrease cardiac risk. Advised to aim for at least 150 minutes of exercise per week.

## 2024-04-03 NOTE — Assessment & Plan Note (Addendum)
 Previous labs reviewed, her A1c has been elevated in the past. I will check an A1c today. Reminded to avoid refined sugars including sugary drinks/foods and processed meats including bacon, sausages and deli meats.

## 2024-05-06 ENCOUNTER — Ambulatory Visit (HOSPITAL_BASED_OUTPATIENT_CLINIC_OR_DEPARTMENT_OTHER): Admitting: Physician Assistant

## 2024-05-06 ENCOUNTER — Encounter (HOSPITAL_BASED_OUTPATIENT_CLINIC_OR_DEPARTMENT_OTHER): Payer: Self-pay | Admitting: Physician Assistant

## 2024-05-06 ENCOUNTER — Encounter: Payer: Self-pay | Admitting: Orthopaedic Surgery

## 2024-05-06 DIAGNOSIS — M25512 Pain in left shoulder: Secondary | ICD-10-CM

## 2024-05-06 DIAGNOSIS — M25511 Pain in right shoulder: Secondary | ICD-10-CM | POA: Diagnosis not present

## 2024-05-06 DIAGNOSIS — G8929 Other chronic pain: Secondary | ICD-10-CM | POA: Diagnosis not present

## 2024-05-06 MED ORDER — METHYLPREDNISOLONE ACETATE 40 MG/ML IJ SUSP
40.0000 mg | INTRAMUSCULAR | Status: AC | PRN
  Administered 2024-05-06: 40 mg via INTRA_ARTICULAR

## 2024-05-06 MED ORDER — LIDOCAINE HCL 1 % IJ SOLN
5.0000 mL | INTRAMUSCULAR | Status: AC | PRN
  Administered 2024-05-06: 5 mL

## 2024-05-06 NOTE — Progress Notes (Signed)
 Office Visit Note   Patient: Ruth Davis           Date of Birth: Sep 24, 1956           MRN: [id number] Visit Date: 05/06/2024              Requested by: Jarold Medici, MD [address] [place],  [place] [address] PCP: Jarold Medici, MD  Chief Complaint  Patient presents with  . 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  She comes in requesting a subacromial injection today.  She has not had no injury but she is going on a trip.  Assessment & Plan: Visit Diagnoses:  1. Chronic pain of both shoulders     Plan: Went forward with a subacromial injection into her right shoulder may follow-up as needed  Follow-Up Instructions: No follow-ups on file.   Ortho Exam  Patient is alert, oriented, no adenopathy, well-dressed, normal affect, normal respiratory effort. Right shoulder she has forward elevation has some pain with internal rotation behind the back no new injury.  Positive impingement findings she is neurovascular intact    Imaging: No results found. No images are attached to the encounter.  Labs: Lab Results  Component Value Date   HGBA1C 6.0 (H) 03/30/2024   HGBA1C 6.1 (H) 09/17/2023   HGBA1C 6.0 (H) 04/17/2023     Lab Results  Component Value Date   ALBUMIN 3.9 03/30/2024   ALBUMIN 3.9 09/17/2023   ALBUMIN 4.1 04/17/2023    No results found for: MG Lab Results  Component Value Date   VD25OH 46.9 10/17/2022   VD25OH 50.1 08/13/2021    No results found for: PREALBUMIN    Latest Ref Rng & Units 03/30/2024    2:50 PM 09/17/2023    3:29 PM 10/17/2022    3:30 PM  CBC EXTENDED  WBC 3.4 - 10.8 x10E3/uL 3.7  4.1  4.2   RBC 3.77 - 5.28 x10E6/uL 3.97  4.20  4.46   Hemoglobin 11.1 - 15.9 g/dL 87.1  86.4  85.5   HCT 34.0 - 46.6 % 39.0  40.1  42.7   Platelets 150 - 450 x10E3/uL 240  259  251      There  is no height or weight on file to calculate BMI.  Orders:  No orders of the defined types were placed in this encounter.  No orders of the defined types were placed in this encounter.    Procedures: Large Joint Inj: R subacromial bursa on 05/06/2024 10:24 AM Indications: diagnostic evaluation and pain Details: 25 G 1.5 in needle  Arthrogram: No  Medications: 5 mL lidocaine  1 %; 40 mg methylPREDNISolone  acetate 40 MG/ML Outcome: tolerated well, no immediate complications Procedure, treatment alternatives, risks and benefits explained, specific risks discussed. Consent was given by the patient.     Clinical Data: No additional findings.  ROS:  All other systems negative, except as noted in the HPI. Review of Systems  Objective: Vital Signs: There were no vitals taken for this visit.  Specialty Comments:  No specialty comments available.  PMFS History: Patient Active Problem List   Diagnosis Date Noted  . Bilateral shoulder pain 10/03/2023  . Myalgia 09/28/2023  . COVID 03/26/2023  . Acute non-recurrent maxillary sinusitis 03/26/2023  . Essential hypertension, benign 10/17/2022  . Other abnormal glucose 10/17/2022  . Vitamin D   deficiency disease 10/17/2022  . Class 1 obesity due to excess calories with serious comorbidity and body mass index (BMI) of 33.0 to 33.9 in adult 10/17/2022  . Pure hypercholesterolemia 09/10/2018  . Abnormal thyroid blood test 09/10/2018   Past Medical History:  Diagnosis Date  . Allergic rhinitis   . Allergy   . Essential hypertension, benign   . High cholesterol     Family History  Problem Relation Age of Onset  . Breast cancer Sister   . Breast cancer Maternal Grandmother   . Alzheimer's disease Mother   . Bone cancer Father   . Throat cancer Brother   . Breast cancer Sister   . Colon cancer Sister     Past Surgical History:  Procedure Laterality Date  . REDUCTION MAMMAPLASTY Bilateral 2008   Social History   Occupational  History  . Not on file  Tobacco Use  . Smoking status: Never  . Smokeless tobacco: Never  Vaping Use  . Vaping status: Never Used  Substance and Sexual Activity  . Alcohol use: Never  . Drug use: Never  . Sexual activity: Not on file

## 2024-05-06 NOTE — Telephone Encounter (Signed)
 Looks like lindsey saw her.  I would think an intraarticular injection.  Hopefully brooks can work her in.

## 2024-05-06 NOTE — Telephone Encounter (Signed)
 Spoke with patient. Dr.Brooks is willing to work her in this afternoon, but according to patient she saw Ronal Dragon this morning and received a subacromial injection. She states that she is feeling better.

## 2024-05-20 ENCOUNTER — Ambulatory Visit: Admitting: Orthopaedic Surgery

## 2024-05-31 ENCOUNTER — Encounter: Payer: Self-pay | Admitting: Internal Medicine

## 2024-06-08 ENCOUNTER — Ambulatory Visit: Payer: Self-pay

## 2024-06-11 ENCOUNTER — Ambulatory Visit (INDEPENDENT_AMBULATORY_CARE_PROVIDER_SITE_OTHER)

## 2024-06-11 VITALS — BP 110/76 | HR 81 | Temp 98.1°F | Ht 63.0 in | Wt 187.0 lb

## 2024-06-11 DIAGNOSIS — Z23 Encounter for immunization: Secondary | ICD-10-CM | POA: Diagnosis not present

## 2024-06-11 NOTE — Progress Notes (Signed)
 Patient is in office today for a nurse visit for  covid Immunization. Patient Injection was given in the  Right deltoid. Patient tolerated injection well.

## 2024-07-16 ENCOUNTER — Encounter: Payer: Self-pay | Admitting: Internal Medicine

## 2024-07-29 ENCOUNTER — Encounter: Payer: Self-pay | Admitting: Internal Medicine

## 2024-08-02 ENCOUNTER — Encounter: Payer: Self-pay | Admitting: Radiology

## 2024-08-24 ENCOUNTER — Encounter: Payer: Self-pay | Admitting: Orthopaedic Surgery

## 2024-08-25 ENCOUNTER — Ambulatory Visit: Admitting: Physician Assistant

## 2024-08-25 ENCOUNTER — Encounter: Payer: Self-pay | Admitting: Physician Assistant

## 2024-08-25 DIAGNOSIS — G8929 Other chronic pain: Secondary | ICD-10-CM

## 2024-08-25 DIAGNOSIS — M25511 Pain in right shoulder: Secondary | ICD-10-CM

## 2024-08-25 MED ORDER — LIDOCAINE HCL 1 % IJ SOLN
5.0000 mL | INTRAMUSCULAR | Status: AC | PRN
  Administered 2024-08-25: 5 mL

## 2024-08-25 MED ORDER — METHYLPREDNISOLONE ACETATE 40 MG/ML IJ SUSP
40.0000 mg | INTRAMUSCULAR | Status: AC | PRN
  Administered 2024-08-25: 40 mg via INTRA_ARTICULAR

## 2024-08-25 NOTE — Progress Notes (Signed)
 Office Visit Note   Patient: Ruth Davis           Date of Birth: 07/03/1956           MRN: [id number] Visit Date: 08/25/2024              Requested by: Jarold Medici, MD [address] [place],  [place] [address] PCP: Jarold Medici, MD  Chief Complaint  Patient presents with  . 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  I did give her an injection last summer she did quite well.  Recently she started bowling and had recurrent symptoms.  Denies any weakness.  She is wondering if something could be done just to get her through the holiday.  She did try a course of physical therapy and this has been going on for several months her x-rays indicate some degenerative changes at the [facility] joint but no other significant osseous abnormalities  Assessment & Plan: Visit Diagnoses:  1. Chronic right shoulder pain     Plan: Will go forward with an injection today.  Because of the chronicity of this problem and she has failed physical therapy in the past been going on greater than 6 weeks would recommend an MRI with right shoulder could then follow-up with Dr. Jerri  Follow-Up Instructions: After MRI  Ortho Exam  Patient is alert, oriented, no adenopathy, well-dressed, normal affect, normal respiratory effort. Examination of her right shoulder she has full range of motion though some pain with going overhead and behind her back.  Biceps tricep strength intact abductor strength intact no paresthesias.  She does have a positive empty can test and a mildly positive speeds test    Imaging: No results found. No images are attached to the encounter.  Labs: Lab Results  Component Value Date   HGBA1C 6.0 (H) 03/30/2024   HGBA1C 6.1 (H) 09/17/2023   HGBA1C 6.0 (H) 04/17/2023     Lab Results  Component Value Date   ALBUMIN 3.9 03/30/2024   ALBUMIN 3.9  09/17/2023   ALBUMIN 4.1 04/17/2023    No results found for: MG Lab Results  Component Value Date   VD25OH 46.9 10/17/2022   VD25OH 50.1 08/13/2021    No results found for: PREALBUMIN    Latest Ref Rng & Units 03/30/2024    2:50 PM 09/17/2023    3:29 PM 10/17/2022    3:30 PM  CBC EXTENDED  WBC 3.4 - 10.8 x10E3/uL 3.7  4.1  4.2   RBC 3.77 - 5.28 x10E6/uL 3.97  4.20  4.46   Hemoglobin 11.1 - 15.9 g/dL 87.1  86.4  85.5   HCT 34.0 - 46.6 % 39.0  40.1  42.7   Platelets 150 - 450 x10E3/uL 240  259  251      There is no height or weight on file to calculate BMI.  Orders:  Orders Placed This Encounter  Procedures  . MR SHOULDER RIGHT WO CONTRAST   No orders of the defined types were placed in this encounter.    Procedures: Large Joint Inj: R subacromial bursa on 08/25/2024 2:29 PM Indications: diagnostic evaluation and pain Details: 25 G 1.5 in needle  Arthrogram: No  Medications: 5 mL lidocaine  1 %; 40 mg methylPREDNISolone  acetate 40 MG/ML Outcome: tolerated well, no immediate complications Procedure, treatment alternatives, risks and benefits explained, specific risks discussed.  Consent was given by the patient.     Clinical Data: No additional findings.  ROS:  All other systems negative, except as noted in the HPI. Review of Systems  Objective: Vital Signs: There were no vitals taken for this visit.  Specialty Comments:  No specialty comments available.  PMFS History: Patient Active Problem List   Diagnosis Date Noted  . Bilateral shoulder pain 10/03/2023  . Myalgia 09/28/2023  . COVID 03/26/2023  . Acute non-recurrent maxillary sinusitis 03/26/2023  . Essential hypertension, benign 10/17/2022  . Other abnormal glucose 10/17/2022  . 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 Class 1 obesity due to excess calories with serious comorbidity and body mass index (BMI) of 33.0 to 33.9 in adult 10/17/2022  . Pure hypercholesterolemia 09/10/2018  .  Abnormal thyroid blood test 09/10/2018   Past Medical History:  Diagnosis Date  . Allergic rhinitis   . Allergy   . Essential hypertension, benign   . High cholesterol     Family History  Problem Relation Age of Onset  . Breast cancer Sister   . Breast cancer Maternal Grandmother   . Alzheimer's disease Mother   . Bone cancer Father   . Throat cancer Brother   . Breast cancer Sister   . Colon cancer Sister     Past Surgical History:  Procedure Laterality Date  . REDUCTION MAMMAPLASTY Bilateral 2008   Social History   Occupational History  . Not on file  Tobacco Use  . Smoking status: Never  . Smokeless tobacco: Never  Vaping Use  . Vaping status: Never Used  Substance and Sexual Activity  . Alcohol use: Never  . Drug use: Never  . Sexual activity: Not on file

## 2024-09-04 ENCOUNTER — Ambulatory Visit
Admission: RE | Admit: 2024-09-04 | Discharge: 2024-09-04 | Disposition: A | Source: Ambulatory Visit | Attending: Physician Assistant

## 2024-09-04 DIAGNOSIS — G8929 Other chronic pain: Secondary | ICD-10-CM

## 2024-09-07 NOTE — Telephone Encounter (Signed)
 Jinnie can you schedule this with Dr burnetta please?

## 2024-09-21 ENCOUNTER — Other Ambulatory Visit: Payer: Self-pay

## 2024-09-21 ENCOUNTER — Ambulatory Visit: Admitting: Sports Medicine

## 2024-09-21 ENCOUNTER — Other Ambulatory Visit: Payer: Self-pay | Admitting: Physician Assistant

## 2024-09-21 ENCOUNTER — Encounter: Payer: Self-pay | Admitting: Sports Medicine

## 2024-09-21 DIAGNOSIS — M19011 Primary osteoarthritis, right shoulder: Secondary | ICD-10-CM | POA: Diagnosis not present

## 2024-09-21 DIAGNOSIS — M25511 Pain in right shoulder: Secondary | ICD-10-CM

## 2024-09-21 DIAGNOSIS — M75101 Unspecified rotator cuff tear or rupture of right shoulder, not specified as traumatic: Secondary | ICD-10-CM | POA: Diagnosis not present

## 2024-09-21 DIAGNOSIS — M12811 Other specific arthropathies, not elsewhere classified, right shoulder: Secondary | ICD-10-CM

## 2024-09-21 DIAGNOSIS — G8929 Other chronic pain: Secondary | ICD-10-CM

## 2024-09-21 MED ORDER — MELOXICAM 7.5 MG PO TABS: ORAL_TABLET | ORAL | 0 refills | Status: AC

## 2024-09-21 MED ORDER — BUPIVACAINE HCL 0.25 % IJ SOLN
2.0000 mL | INTRAMUSCULAR | Status: AC | PRN
  Administered 2024-09-21: 2 mL via INTRA_ARTICULAR

## 2024-09-21 MED ORDER — LIDOCAINE HCL 1 % IJ SOLN
2.0000 mL | INTRAMUSCULAR | Status: AC | PRN
  Administered 2024-09-21: 2 mL

## 2024-09-21 MED ORDER — METHYLPREDNISOLONE ACETATE 40 MG/ML IJ SUSP
80.0000 mg | INTRAMUSCULAR | Status: AC | PRN
  Administered 2024-09-21: 80 mg via INTRA_ARTICULAR

## 2024-09-21 NOTE — Addendum Note (Signed)
 Addended by: Geraldine Sandberg W III on: 09/21/2024 04:55 PM   Modules accepted: Orders

## 2024-09-21 NOTE — Progress Notes (Addendum)
 "  Ruth Davis - 68 y.o. female MRN [id number]  Date of birth: 1955-12-28  Office Visit Note: Visit Date: 09/21/2024 PCP: Jarold Medici, MD Referred by: Jarold Medici, MD  Subjective: Chief Complaint  Patient presents with   Right Shoulder - Pain   HPI: Ruth Davis is a pleasant 68 y.o. female who presents today for acute on chronic right shoulder pain.  She would also like to go through her MRI today which is ordered by [facility].  Right shoulder -she has a history of chronic shoulder pain, has been diagnosed with impingement in the past as well as some degenerative changes within the joint and the [facility] joint.  She had done well with 2 separate rounds of formalized physical therapy as well as a subacromial joint injection in years past.  However most recently she had a subacromial joint injection on 08/25/2024 which did not give her any significant lasting relief.  She presents today after MRI to review and discussion on possible intra-articular injection under ultrasound guidance.  She is using meloxicam  7.5 mg once to twice daily, but still having rather bothersome pain.  She is prediabetic, not on any medication for this. Lab Results  Component Value Date   HGBA1C 6.0 (H) 03/30/2024   Pertinent ROS were reviewed with the patient and found to be negative unless otherwise specified above in HPI.   Assessment & Plan: Visit Diagnoses:  1. Primary osteoarthritis, right shoulder   2. Chronic right shoulder pain   3. Rotator cuff tear arthropathy of right shoulder    Plan: Impression is acute exacerbation of chronic right shoulder pain which is multifactorial in nature.  She does have MRI confirmed at least moderate osteoarthritic change within the glenohumeral joint and moderate to severe AC joint arthralgia although the [facility] joint pathology seems less bothersome today.  She also has global rotator cuff tendinosis with associated partial-thickness tearing of the  supraspinatus and infraspinatus.  Received much relief from subacromial joint injection.  Through shared decision making for both diagnostic and hopefully therapeutic purposes, we did proceed with ultrasound-guided right shoulder intra-articular injection, patient tolerated well.  Advised on postinjection protocol.  May use ice/heat and Tylenol for postinjection pain.  Following this she will resume her meloxicam  7.5 mg once to twice daily.  I would like her to take at least the next 72 hours off from specific activity and PT.  After the next 3-4 days and pain relief, she may resume her home exercise regimen.  She will follow-up with Dr. Jerri for the right shoulder to see how she responds in the next 4 to 5 weeks.  Hopefully this is helpful for her.  Ultimately intervention could include reverse total shoulder arthroplasty, we will have her discuss this further at her follow-up with Dr. Jerri.  Follow-up: Return for F/u with Dr. Jerri in 4-5 weeks for R-shoulder .   Meds & Orders:  Meds ordered this encounter  Medications   bupivacaine  (MARCAINE ) 0.25 % (with pres) injection 2 mL   lidocaine  (XYLOCAINE ) 1 % (with pres) injection 2 mL   methylPREDNISolone  acetate (DEPO-MEDROL ) injection 80 mg   meloxicam  (MOBIC ) 7.5 MG tablet    Sig: TAKE 1 TABLET BY MOUTH TWICE A DAY AS NEEDED FOR SHOULDER PAIN    Dispense:  60 tablet    Refill:  0    PT NEEDS REFILLS    Orders Placed This Encounter  Procedures   Large Joint Inj: R glenohumeral   US  Guided Needle Placement -  No Linked Charges     Procedures: Large Joint Inj: R glenohumeral on 09/21/2024 1:27 PM Indications: pain Details: 22 G 3.5 in needle, ultrasound-guided posterior approach Medications: 2 mL lidocaine  1 %; 2 mL bupivacaine  0.25 %; 80 mg methylPREDNISolone  acetate 40 MG/ML Outcome: tolerated well, no immediate complications  US -guided glenohumeral joint injection, Right shoulder After discussion on risks/benefits/indications, informed verbal  consent was obtained. A timeout was then performed. The patient was positioned lying lateral recumbent on examination table. The patient's shoulder was prepped with betadine and multiple alcohol swabs and utilizing ultrasound guidance, the patient's glenohumeral joint was identified on ultrasound. Using ultrasound guidance a 22-gauge, 3.5 inch needle with a mixture of 2:2:2 cc's lidocaine :bupivicaine:depomedrol was directed from a lateral to medial direction via in-plane technique into the glenohumeral joint with visualization of appropriate spread of injectate into the joint. Patient tolerated the procedure well without immediate complications.      Procedure, treatment alternatives, risks and benefits explained, specific risks discussed. Consent was given by the patient. Immediately prior to procedure a time out was called to verify the correct patient, procedure, equipment, support staff and site/side marked as required. Patient was prepped and draped in the usual sterile fashion.          Clinical History: No specialty comments available.  She reports that she has never smoked. She has never used smokeless tobacco.  Recent Labs    03/30/24 1450  HGBA1C 6.0*    Objective:    Physical Exam  Gen: Well-appearing, in no acute distress; non-toxic CV: Well-perfused. Warm.  Resp: Breathing unlabored on room air; no wheezing. Psych: Fluid speech in conversation; appropriate affect; normal thought process  Ortho Exam - Right shoulder: Trace effusion of the right shoulder joint.  There is pain within the anterior joint recess as well as at Codman's point.  There is pain and limitation with active range of motion specifically with flexion, abduction.  There is some grating with pain in all planes with bony restriction.  No significant tenderness at the [facility] joint today, + mildly positive crossarm adduction.  Imaging:  *I did independently review and interpret the shoulder MRI today during  office visit, as well as review this includes a shoulder monitor reviewed with the patient in the room today.  My review demonstrates at least moderate glenohumeral and moderate to severe AC joint arthritis with global rotator cuff tendinosis with associated partial tearing of the supraspinatus and infraspinatus more at the crossover junction.  There is associated obliteration of the labrum which appears degenerative in nature.  Narrative & Impression  CLINICAL DATA:  Chronic shoulder pain.   EXAM: MRI OF THE RIGHT SHOULDER WITHOUT CONTRAST   TECHNIQUE: Multiplanar, multisequence MR imaging of the shoulder was performed. No intravenous contrast was administered.   COMPARISON:  Right shoulder radiograph dated 11/14/2023.   FINDINGS: Rotator cuff: Moderate-to-severe tendinosis of the supraspinatus tendon with low-grade partial-thickness articular sided insertional tear anteriorly. Moderate-to-severe tendinosis of the infraspinatus tendon with interstitial tear at the myotendinous junction. Subscapularis tendon is intact with moderate tendinosis. Teres minor tendon is intact.   Muscles: No significant muscle atrophy or edema.   Biceps Long Head: Tendinosis of the intra-articular biceps tendon with bicipital tenosynovitis.   Acromioclavicular Joint: Moderate-to-severe arthropathy of the acromioclavicular joint with joint space narrowing, osteophytosis, and subchondral cystic changes and edema of the distal clavicle. Type 2 acromion. No subacromial/subdeltoid bursal fluid.   Glenohumeral Joint: Trace joint effusion. Full-thickness chondral defect with delamination of the superior glenoid  articular cartilage measuring 11 x 11 mm. Marginal osteophytosis of the inferior humeral head.   Labrum: Tear of the superior labrum extending anteriorly and posteriorly.   Bones: No fracture or dislocation. No marrow replacing osseous lesion.   Other: No fluid collection.   IMPRESSION: 1.  Moderate-to-severe tendinosis of the supraspinatus tendon with low-grade partial-thickness articular sided insertional tear. 2. Moderate-to-severe tendinosis of the infraspinatus tendon with interstitial tear at the myotendinous junction. 3. Moderate tendinosis of the subscapularis tendon. 4. Tendinosis of the intra-articular biceps tendon with bicipital tenosynovitis. 5. Tear of the superior labrum extending anteriorly and posteriorly. 6. Full-thickness chondral defect with delamination of the superior glenoid articular cartilage. Moderate glenohumeral joint osteoarthritis. 7. Moderate-to-severe degenerative arthropathy of the acromioclavicular joint.     Electronically Signed   By: Harrietta Sherry M.D.   On: 09/05/2024 17:14    Past Medical/Family/Surgical/Social History: Medications & Allergies reviewed per EMR, new medications updated. Patient Active Problem List   Diagnosis Date Noted   Bilateral shoulder pain 10/03/2023   Myalgia 09/28/2023   COVID 03/26/2023   Acute non-recurrent maxillary sinusitis 03/26/2023   Essential hypertension, benign 10/17/2022   Other abnormal glucose 10/17/2022   Vitamin D  deficiency disease 10/17/2022   Class 1 obesity due to excess calories with serious comorbidity and body mass index (BMI) of 33.0 to 33.9 in adult 10/17/2022   Pure hypercholesterolemia 09/10/2018   Abnormal thyroid blood test 09/10/2018   Past Medical History:  Diagnosis Date   Allergic rhinitis    Allergy    Essential hypertension, benign    High cholesterol    Family History  Problem Relation Age of Onset   Breast cancer Sister    Breast cancer Maternal Grandmother    Alzheimer's disease Mother    Bone cancer Father    Throat cancer Brother    Breast cancer Sister    Colon cancer Sister    Past Surgical History:  Procedure Laterality Date   REDUCTION MAMMAPLASTY Bilateral 2008   Social History   Occupational History   Not on file  Tobacco Use    Smoking status: Never   Smokeless tobacco: Never  Vaping Use   Vaping status: Never Used  Substance and Sexual Activity   Alcohol use: Never   Drug use: Never   Sexual activity: Not on file   "

## 2024-10-01 ENCOUNTER — Ambulatory Visit

## 2024-10-01 DIAGNOSIS — Z0000 Encounter for general adult medical examination without abnormal findings: Secondary | ICD-10-CM

## 2024-10-01 NOTE — Progress Notes (Signed)
 "  No chief complaint on file.    Subjective:   Ruth Davis is a 69 y.o. female who presents for a Medicare Annual Wellness Visit.  Visit info / Clinical Intake: Medicare Wellness Visit Type:: Subsequent Annual Wellness Visit Persons participating in visit and providing information:: patient Medicare Wellness Visit Mode:: Telephone If telephone:: video declined Since this visit was completed virtually, some vitals may be partially provided or unavailable. Missing vitals are due to the limitations of the virtual format.: Unable to obtain vitals - no equipment If Telephone or Video please confirm:: I connected with patient using audio/video enable telemedicine. I verified patient identity with two identifiers, discussed telehealth limitations, and patient agreed to proceed. Patient Location:: home Provider Location:: office/ clinic Interpreter Needed?: No Pre-visit prep was completed: yes AWV questionnaire completed by patient prior to visit?: yes Date:: 09/29/24 Living arrangements:: lives with spouse/significant other Patient's Overall Health Status Rating: good Typical amount of pain: some Does pain affect daily life?: no Are you currently prescribed opioids?: no  Dietary Habits and Nutritional Risks How many meals a day?: 3 Eats fruit and vegetables daily?: yes Most meals are obtained by: preparing own meals In the last 2 weeks, have you had any of the following?: none Diabetic:: no  Functional Status Activities of Daily Living (to include ambulation/medication): Independent Ambulation: Independent Medication Administration: Independent Home Management (perform basic housework or laundry): Independent Manage your own finances?: yes Primary transportation is: driving Concerns about vision?: no *vision screening is required for WTM* Concerns about hearing?: no  Fall Screening Falls in the past year?: 0 Number of falls in past year: 0 Was there an injury with  Fall?: 0 Fall Risk Category Calculator: 0 Patient Fall Risk Level: Low Fall Risk  Fall Risk Patient at Risk for Falls Due to: No Fall Risks Fall risk Follow up: Falls evaluation completed  Home and Transportation Safety: All rugs have non-skid backing?: yes All stairs or steps have railings?: yes Grab bars in the bathtub or shower?: (!) no Have non-skid surface in bathtub or shower?: yes Good home lighting?: yes Regular seat belt use?: yes Hospital stays in the last year:: no  Cognitive Assessment Difficulty concentrating, remembering, or making decisions? : (Patient-Rptd) no Will 6CIT or Mini Cog be Completed: yes What year is it?: 0 points What month is it?: 0 points Give patient an address phrase to remember (5 components): [address] [place] About what time is it?: 0 points Count backwards from 20 to 1: 0 points Say the months of the year in reverse: 0 points Repeat the address phrase from earlier: 0 points 6 CIT Score: 0 points  Advance Directives (For Healthcare) Does Patient Have a Medical Advance Directive?: Yes Does patient want to make changes to medical advance directive?: No - Patient declined Type of Advance Directive: Living will Copy of Living Will in Chart?: No - copy requested  Reviewed/Updated  Reviewed/Updated: Reviewed All (Medical, Surgical, Family, Medications, Allergies, Care Teams, Patient Goals)    Allergies (verified) Acetaminophen and Fomepizole   Current Medications (verified) Outpatient Encounter Medications as of 10/01/2024  Medication Sig   albuterol (VENTOLIN HFA) 108 (90 Base) MCG/ACT inhaler Inhale into the lungs every 6 (six) hours as needed for wheezing or shortness of breath.   amLODipine  (NORVASC ) 2.5 MG tablet Take 1 tablet (2.5 mg total) by mouth daily.   atorvastatin  (LIPITOR) 80 MG tablet Take 1 tablet (80 mg total) by mouth daily.   azelastine  (ASTELIN ) 0.1 % nasal spray  Place 1 spray into both nostrils 2 (two) times  daily. Use in each nostril as directed   meloxicam  (MOBIC ) 7.5 MG tablet TAKE 1 TABLET BY MOUTH TWICE A DAY AS NEEDED FOR SHOULDER PAIN   No facility-administered encounter medications on file as of 10/01/2024.    History: Past Medical History:  Diagnosis Date   Allergic rhinitis    Allergy    Arthritis    Essential hypertension, benign    High cholesterol    Past Surgical History:  Procedure Laterality Date   REDUCTION MAMMAPLASTY Bilateral 2008   TUBAL LIGATION  1988   Family History  Problem Relation Age of Onset   Breast cancer Sister    Cancer Sister    Hypertension Sister    Breast cancer Maternal Grandmother    Alzheimer's disease Mother    Hypertension Mother    Asthma Mother    Varicose Veins Mother    Bone cancer Father    Alcohol abuse Father    Cancer Father    Throat cancer Brother    Cancer Brother    Breast cancer Sister    Colon cancer Sister    Cancer Brother    Cancer Sister    Heart disease Paternal Uncle    Heart disease Paternal Uncle    Hypertension Sister    Hypertension Sister    Social History   Occupational History   Not on file  Tobacco Use   Smoking status: Never   Smokeless tobacco: Never  Vaping Use   Vaping status: Never Used  Substance and Sexual Activity   Alcohol use: Not Currently    Comment: Occasionally   Drug use: Never   Sexual activity: Yes    Birth control/protection: Post-menopausal   Tobacco Counseling Counseling given: Not Answered  SDOH Screenings   Food Insecurity: No Food Insecurity (10/01/2024)  Housing: Low Risk (09/29/2024)  Transportation Needs: No Transportation Needs (09/29/2024)  Utilities: Not At Risk (09/16/2023)  Alcohol Screen: Low Risk (09/29/2024)  Depression (PHQ2-9): Low Risk (10/01/2024)  Financial Resource Strain: Low Risk (09/29/2024)  Physical Activity: Sufficiently Active (10/01/2024)  Social Connections: Socially Integrated (10/01/2024)  Stress: No Stress Concern Present (10/01/2024)   Tobacco Use: Low Risk (09/21/2024)  Health Literacy: Adequate Health Literacy (10/01/2024)   See flowsheets for full screening details  Depression Screen PHQ 2 & 9 Depression Scale- Over the past 2 weeks, how often have you been bothered by any of the following problems? Little interest or pleasure in doing things: 0 Feeling down, depressed, or hopeless (PHQ Adolescent also includes...irritable): 0 PHQ-2 Total Score: 0 Trouble falling or staying asleep, or sleeping too much: 0 Feeling tired or having little energy: 0 Poor appetite or overeating (PHQ Adolescent also includes...weight loss): 0 Feeling bad about yourself - or that you are a failure or have let yourself or your family down: 0 Trouble concentrating on things, such as reading the newspaper or watching television (PHQ Adolescent also includes...like school work): 0 Moving or speaking so slowly that other people could have noticed. Or the opposite - being so fidgety or restless that you have been moving around a lot more than usual: 0 Thoughts that you would be better off dead, or of hurting yourself in some way: 0 PHQ-9 Total Score: 0 If you checked off any problems, how difficult have these problems made it for you to do your work, take care of things at home, or get along with other people?: Not difficult at all  Goals Addressed               This Visit's Progress     lose 15 pounds (pt-stated)        Watch meal consumption & portion control.              Objective:    There were no vitals filed for this visit. There is no height or weight on file to calculate BMI.  Hearing/Vision screen No results found. Immunizations and Health Maintenance Health Maintenance  Topic Date Due   COVID-19 Vaccine (9 - 2025-26 season) 12/09/2024   Medicare Annual Wellness (AWV)  10/01/2025   Mammogram  11/05/2025   DTaP/Tdap/Td (2 - Td or Tdap) 09/27/2026   Colonoscopy  11/11/2033   Pneumococcal Vaccine: 50+ Years   Completed   Influenza Vaccine  Completed   Bone Density Scan  Completed   Hepatitis C Screening  Completed   Zoster Vaccines- Shingrix  Completed   Meningococcal B Vaccine  Aged Out        Assessment/Plan:  This is a routine wellness examination for Tice.  Patient Care Team: Jarold Medici, MD as PCP - General (Internal Medicine)  I have personally reviewed and noted the following in the patients chart:   Medical and social history Use of alcohol, tobacco or illicit drugs  Current medications and supplements including opioid prescriptions. Functional ability and status Nutritional status Physical activity Advanced directives List of other physicians Hospitalizations, surgeries, and ER visits in previous 12 months Vitals Screenings to include cognitive, depression, and falls Referrals and appointments  No orders of the defined types were placed in this encounter.  In addition, I have reviewed and discussed with patient certain preventive protocols, quality metrics, and best practice recommendations. A written personalized care plan for preventive services as well as general preventive health recommendations were provided to patient.   Richerd ONEIDA Lemmings, CMA   10/01/2024   Return in 1 year (on 10/01/2025).  After Visit Summary: (MyChart) Due to this being a telephonic visit, the after visit summary with patients personalized plan was offered to patient via MyChart   Nurse Notes: Richerd Lemmings, CMA  "

## 2024-10-05 ENCOUNTER — Encounter: Payer: Self-pay | Admitting: Orthopaedic Surgery

## 2024-10-05 NOTE — Telephone Encounter (Signed)
 We can put her on a cancellation list for earlier appointment if possible.  Thanks.

## 2024-10-06 ENCOUNTER — Ambulatory Visit: Payer: Medicare Other

## 2024-10-06 ENCOUNTER — Ambulatory Visit (INDEPENDENT_AMBULATORY_CARE_PROVIDER_SITE_OTHER): Payer: Self-pay | Admitting: Internal Medicine

## 2024-10-06 ENCOUNTER — Encounter: Payer: Self-pay | Admitting: Internal Medicine

## 2024-10-06 VITALS — BP 116/74 | HR 71 | Temp 98.1°F | Ht 63.0 in | Wt 188.0 lb

## 2024-10-06 DIAGNOSIS — Z8249 Family history of ischemic heart disease and other diseases of the circulatory system: Secondary | ICD-10-CM

## 2024-10-06 DIAGNOSIS — E6609 Other obesity due to excess calories: Secondary | ICD-10-CM | POA: Diagnosis not present

## 2024-10-06 DIAGNOSIS — R079 Chest pain, unspecified: Secondary | ICD-10-CM

## 2024-10-06 DIAGNOSIS — I10 Essential (primary) hypertension: Secondary | ICD-10-CM | POA: Diagnosis not present

## 2024-10-06 DIAGNOSIS — E7800 Pure hypercholesterolemia, unspecified: Secondary | ICD-10-CM

## 2024-10-06 DIAGNOSIS — Z6833 Body mass index (BMI) 33.0-33.9, adult: Secondary | ICD-10-CM

## 2024-10-06 DIAGNOSIS — M5412 Radiculopathy, cervical region: Secondary | ICD-10-CM

## 2024-10-06 DIAGNOSIS — M25432 Effusion, left wrist: Secondary | ICD-10-CM

## 2024-10-06 DIAGNOSIS — R7309 Other abnormal glucose: Secondary | ICD-10-CM | POA: Diagnosis not present

## 2024-10-06 DIAGNOSIS — E66811 Obesity, class 1: Secondary | ICD-10-CM | POA: Diagnosis not present

## 2024-10-06 MED ORDER — AMLODIPINE BESYLATE 2.5 MG PO TABS: 2.5000 mg | ORAL_TABLET | Freq: Every day | ORAL | 2 refills | Status: AC

## 2024-10-06 NOTE — Assessment & Plan Note (Addendum)
 Previous labs reviewed, her A1c has been elevated in the past. I will check an A1c today. Reminded to avoid refined sugars including sugary drinks/foods and processed meats including bacon, sausages and deli meats.

## 2024-10-06 NOTE — Patient Instructions (Signed)
 Hypertension, Adult Hypertension is another name for high blood pressure. High blood pressure forces your heart to work harder to pump blood. This can cause problems over time. There are two numbers in a blood pressure reading. There is a top number (systolic) over a bottom number (diastolic). It is best to have a blood pressure that is below 120/80. What are the causes? The cause of this condition is not known. Some other conditions can lead to high blood pressure. What increases the risk? Some lifestyle factors can make you more likely to develop high blood pressure: Smoking. Not getting enough exercise or physical activity. Being overweight. Having too much fat, sugar, calories, or salt (sodium) in your diet. Drinking too much alcohol. Other risk factors include: Having any of these conditions: Heart disease. Diabetes. High cholesterol. Kidney disease. Obstructive sleep apnea. Having a family history of high blood pressure and high cholesterol. Age. The risk increases with age. Stress. What are the signs or symptoms? High blood pressure may not cause symptoms. Very high blood pressure (hypertensive crisis) may cause: Headache. Fast or uneven heartbeats (palpitations). Shortness of breath. Nosebleed. Vomiting or feeling like you may vomit (nauseous). Changes in how you see. Very bad chest pain. Feeling dizzy. Seizures. How is this treated? This condition is treated by making healthy lifestyle changes, such as: Eating healthy foods. Exercising more. Drinking less alcohol. Your doctor may prescribe medicine if lifestyle changes do not help enough and if: Your top number is above 130. Your bottom number is above 80. Your personal target blood pressure may vary. Follow these instructions at home: Eating and drinking  If told, follow the DASH eating plan. To follow this plan: Fill one half of your plate at each meal with fruits and vegetables. Fill one fourth of your plate  at each meal with whole grains. Whole grains include whole-wheat pasta, brown rice, and whole-grain bread. Eat or drink low-fat dairy products, such as skim milk or low-fat yogurt. Fill one fourth of your plate at each meal with low-fat (lean) proteins. Low-fat proteins include fish, chicken without skin, eggs, beans, and tofu. Avoid fatty meat, cured and processed meat, or chicken with skin. Avoid pre-made or processed food. Limit the amount of salt in your diet to less than 1,500 mg each day. Do not drink alcohol if: Your doctor tells you not to drink. You are pregnant, may be pregnant, or are planning to become pregnant. If you drink alcohol: Limit how much you have to: 0-1 drink a day for women. 0-2 drinks a day for men. Know how much alcohol is in your drink. In the [place] one drink equals one 12 oz bottle of beer (355 mL), one 5 oz glass of wine (148 mL), or one 1 oz glass of hard liquor (44 mL). Lifestyle  Work with your doctor to stay at a healthy weight or to lose weight. Ask your doctor what the best weight is for you. Get at least 30 minutes of exercise that causes your heart to beat faster (aerobic exercise) most days of the week. This may include walking, swimming, or biking. Get at least 30 minutes of exercise that strengthens your muscles (resistance exercise) at least 3 days a week. This may include lifting weights or doing Pilates. Do not smoke or use any products that contain nicotine or tobacco. If you need help quitting, ask your doctor. Check your blood pressure at home as told by your doctor. Keep all follow-up visits. Medicines Take over-the-counter and prescription medicines  only as told by your doctor. Follow directions carefully. Do not skip doses of blood pressure medicine. The medicine does not work as well if you skip doses. Skipping doses also puts you at risk for problems. Ask your doctor about side effects or reactions to medicines that you should watch  for. Contact a doctor if: You think you are having a reaction to the medicine you are taking. You have headaches that keep coming back. You feel dizzy. You have swelling in your ankles. You have trouble with your vision. Get help right away if: You get a very bad headache. You start to feel mixed up (confused). You feel weak or numb. You feel faint. You have very bad pain in your: Chest. Belly (abdomen). You vomit more than once. You have trouble breathing. These symptoms may be an emergency. Get help right away. Call 911. Do not wait to see if the symptoms will go away. Do not drive yourself to the hospital. Summary Hypertension is another name for high blood pressure. High blood pressure forces your heart to work harder to pump blood. For most people, a normal blood pressure is less than 120/80. Making healthy choices can help lower blood pressure. If your blood pressure does not get lower with healthy choices, you may need to take medicine. This information is not intended to replace advice given to you by your health care provider. Make sure you discuss any questions you have with your health care provider. Document Revised: 07/05/2021 Document Reviewed: 07/05/2021 Elsevier Patient Education  2024 [employer].

## 2024-10-06 NOTE — Assessment & Plan Note (Signed)
 Chronic, she will c/w atorvastatin  80mg  daily. She is encouraged to follow a heart healthy diet. She will rto in four to six months for re-evaluation. She is encouraged to follow a heart healthy lifestyle.

## 2024-10-06 NOTE — Assessment & Plan Note (Signed)
Chronic, well controlled. She will c/w amlodipine 2.5mg  daily. She is encouraged to follow a low sodium diet and c/w regular exercise regimen. F/u 6 months.

## 2024-10-06 NOTE — Assessment & Plan Note (Addendum)
 She is encouraged to strive for BMI less than 30 to decrease cardiac risk. Advised to aim for at least 150 minutes of exercise per week.

## 2024-10-06 NOTE — Progress Notes (Signed)
 +I,Jameka J Llittleton, CMA,acting as a neurosurgeon for Ruth LOISE Slocumb, MD.,have documented all relevant documentation on the behalf of Ruth LOISE Slocumb, MD,as directed by  Ruth LOISE Slocumb, MD while in the presence of Ruth LOISE Slocumb, MD.  Subjective:  Patient ID: Ruth Davis , female    DOB: 12-22-1955 , 69 y.o.   MRN: [id number]  Chief Complaint  Patient presents with   Hypertension    Patient presents today for a bpc. Patient reports compliance with her meds. Patient denies having chest pain, sob or headaches at this time. She reports she has a few concerns she has been having burning sensation in her chest when she walks and she has noticed knots in her fingers.     HPI Discussed the use of AI scribe software for clinical note transcription with the patient, who gave verbal consent to proceed.  History of Present Illness Noami Davis is a 69 year old female with hypertension who presents with exertional chest burning.  She has been experiencing a burning sensation in her chest for the past three weeks, which occurs during walking and significantly slows her down or stops her. The sensation is sometimes followed by an urge to vomit, although she has not eaten before her morning walks. The burning does not occur when she walks slowly. No shortness of breath, palpitations, or chest pain described as squeezing or pressure. She has not taken any medication for this symptom as she was unsure of its cause.  She is currently taking amlodipine  2.5 mg for hypertension and atorvastatin  for cholesterol management. She has never taken aspirin.  Additionally, she mentions the development of small nodules on her hands, which she has not noticed in the past. These nodules are not tender unless palpated and do not interfere with her ability to make a fist.  She also reports pain radiating from her neck into her shoulder, for which she is seeing an orthopedic specialist. 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 She reports that it is just sore where she did it, but otherwise this side is fine now. The neck pain started about a week ago, and she wonders if it might be related to her pillow or recent travel.  She inquires about the frequency of mammograms, noting that she is due for one, as her last was in January 2025.   Hypertension This is a new problem. The current episode started 1 to 4 weeks ago. The problem has been gradually improving since onset. The problem is controlled. Associated symptoms include chest pain. Pertinent negatives include no blurred vision or shortness of breath. Past treatments include calcium  channel blockers. The current treatment provides moderate improvement.  Hyperlipidemia This is a chronic problem. The current episode started more than 1 year ago. The problem is uncontrolled. Recent lipid tests were reviewed and are high. Associated symptoms include chest pain and myalgias. Pertinent negatives include no focal weakness or shortness of breath. She is currently on no antihyperlipidemic treatment.     Past Medical History:  Diagnosis Date   Allergic rhinitis    Allergy    Arthritis    Essential hypertension, benign    High cholesterol      Family History  Problem Relation Age of Onset   Breast cancer Sister    Cancer Sister    Hypertension Sister    Breast cancer Maternal Grandmother  Alzheimer's disease Mother    Hypertension Mother    Asthma Mother    Varicose Veins Mother    Bone cancer Father    Alcohol abuse Father    Cancer Father    Throat cancer Brother    Cancer Brother    Breast cancer Sister    Colon cancer Sister    Cancer Brother    Cancer Sister    Heart disease Paternal Uncle    Heart disease Paternal Uncle    Hypertension Sister    Hypertension Sister      Current Outpatient Medications:    albuterol  (VENTOLIN HFA) 108 (90 Base) MCG/ACT inhaler, Inhale into the lungs every 6 (six) hours as needed for wheezing or shortness of breath., Disp: , Rfl:    atorvastatin  (LIPITOR) 80 MG tablet, Take 1 tablet (80 mg total) by mouth daily., Disp: 90 tablet, Rfl: 1   azelastine  (ASTELIN ) 0.1 % nasal spray, Place 1 spray into both nostrils 2 (two) times daily. Use in each nostril as directed, Disp: 30 mL, Rfl: 0   meloxicam  (MOBIC ) 7.5 MG tablet, TAKE 1 TABLET BY MOUTH TWICE A DAY AS NEEDED FOR SHOULDER PAIN, Disp: 60 tablet, Rfl: 0   amLODipine  (NORVASC ) 2.5 MG tablet, Take 1 tablet (2.5 mg total) by mouth daily., Disp: 90 tablet, Rfl: 2   Allergies  Allergen Reactions   Acetaminophen Other (See Comments)    Hives/swelling   Fomepizole Other (See Comments)    Hives/swelling     Review of Systems  Eyes:  Negative for blurred vision.  Respiratory:  Negative for shortness of breath.   Cardiovascular:  Positive for chest pain.  Musculoskeletal:  Positive for myalgias.  Neurological:  Negative for focal weakness.     Today's Vitals   10/06/24 1433  BP: 116/74  Pulse: 71  Temp: 98.1 F (36.7 C)  TempSrc: Oral  Weight: 188 lb (85.3 kg)  Height: 5' 3 (1.6 m)  PainSc: 0-No pain   Body mass index is 33.3 kg/m.  Wt Readings from Last 3 Encounters:  10/06/24 188 lb (85.3 kg)  06/11/24 187 lb (84.8 kg)  03/30/24 187 lb (84.8 kg)    The 10-year ASCVD risk score (Arnett DK, et al., 2019) is: 6.6%   Values used to calculate the score:     Age: 89 years     Clinically relevant sex: Female     Is Non-Hispanic African American: Yes     Diabetic: No     Tobacco smoker: No     Systolic Blood Pressure: 116 mmHg     Is BP treated: Yes     HDL Cholesterol: 55 mg/dL     Total Cholesterol: 145 mg/dL  Objective:  Physical Exam Vitals and nursing note reviewed.  Constitutional:      Appearance: Normal appearance.  HENT:     Head: Normocephalic and atraumatic.  Eyes:     Extraocular  Movements: Extraocular movements intact.  Cardiovascular:     Rate and Rhythm: Normal rate and regular rhythm.     Heart sounds: Normal heart sounds.  Pulmonary:     Effort: Pulmonary effort is normal.     Breath sounds: Normal breath sounds.  Musculoskeletal:     Cervical back: Normal range of motion.     Comments: Heberden's nodes present b/l hands Left wrist/forearm swelling  Skin:    General: Skin is warm.  Neurological:     General: No focal deficit present.     Mental  Status: She is alert.  Psychiatric:        Mood and Affect: Mood normal.        Behavior: Behavior normal.         Assessment And Plan:   Assessment & Plan Essential hypertension, benign Chronic, well controlled. She will c/w amlodipine  2.5mg  daily. She is encouraged to follow a low sodium diet and c/w regular exercise regimen. F/u 6 months.  Pure hypercholesterolemia Chronic, she will c/w atorvastatin  80mg  daily. She is encouraged to follow a heart healthy diet. She will rto in four to six months for re-evaluation. She is encouraged to follow a heart healthy lifestyle.    Exertional chest pain Exertional chest discomfort and possible gastroesophageal reflux Intermittent exertional chest burning with nausea. Differential includes cardiac etiology and gastroesophageal reflux. Sx are concerning for cardiac etiology.  - Ordered EKG, NSR w/ nonspecific T abnormality. - Prescribed daily heartburn medication. - Initiated baby aspirin 81mg  daily.  - Referred to cardiologist for further evaluation. - Pt advised to STOP exercising since this triggers her sx, until further evaluation by Cardiology. - Go to ER if sx recur - Stop eating 3 hrs prior to lying down. - Pt advised workup will likely include CCTA and/or stress test.  Cervical radiculopathy She is encouraged to discuss further at upcoming Ortho appt. Swelling of left wrist Apply Voltaren gel to affected area 2-3 times daily prn.  - Encouraged to notify  Ortho at upcoming visit.  Other abnormal glucose Previous labs reviewed, her A1c has been elevated in the past. I will check an A1c today. Reminded to avoid refined sugars including sugary drinks/foods and processed meats including bacon, sausages and deli meats.   Class 1 obesity due to excess calories with serious comorbidity and body mass index (BMI) of 33.0 to 33.9 in adult She is encouraged to strive for BMI less than 30 to decrease cardiac risk. Advised to aim for at least 150 minutes of exercise per week.  Family history of heart disease    Orders Placed This Encounter  Procedures   CMP14+EGFR   Hemoglobin A1c   Lipoprotein A (LPA)   ANA, IFA (with reflex)   CYCLIC CITRUL PEPTIDE ANTIBODY, IGG/IGA   Rheumatoid factor   Sedimentation rate   Uric acid   Ambulatory referral to Cardiology   EKG 12-Lead     Return in about 6 months (around 04/05/2025), or physical.  Patient was given opportunity to ask questions. Patient verbalized understanding of the plan and was able to repeat key elements of the plan. All questions were answered to their satisfaction.    I, Ruth LOISE Slocumb, MD, have reviewed all documentation for this visit. The documentation on 10/06/2024 for the exam, diagnosis, procedures, and orders are all accurate and complete.   IF YOU HAVE BEEN REFERRED TO A SPECIALIST, IT MAY TAKE 1-2 WEEKS TO SCHEDULE/PROCESS THE REFERRAL. IF YOU HAVE NOT HEARD FROM US /SPECIALIST IN TWO WEEKS, PLEASE GIVE US  A CALL AT [phone].

## 2024-10-08 ENCOUNTER — Other Ambulatory Visit: Payer: Self-pay | Admitting: Internal Medicine

## 2024-10-08 DIAGNOSIS — Z1231 Encounter for screening mammogram for malignant neoplasm of breast: Secondary | ICD-10-CM

## 2024-10-08 LAB — CMP14+EGFR
ALT: 18 IU/L (ref 0–32)
AST: 21 IU/L (ref 0–40)
Albumin: 4 g/dL (ref 3.9–4.9)
Alkaline Phosphatase: 59 IU/L (ref 49–135)
BUN/Creatinine Ratio: 17 (ref 12–28)
BUN: 12 mg/dL (ref 8–27)
Bilirubin Total: 0.4 mg/dL (ref 0.0–1.2)
CO2: 26 mmol/L (ref 20–29)
Calcium: 8.8 mg/dL (ref 8.7–10.3)
Chloride: 103 mmol/L (ref 96–106)
Creatinine, Ser: 0.72 mg/dL (ref 0.57–1.00)
Globulin, Total: 2.8 g/dL (ref 1.5–4.5)
Glucose: 103 mg/dL — ABNORMAL HIGH (ref 70–99)
Potassium: 4.1 mmol/L (ref 3.5–5.2)
Sodium: 142 mmol/L (ref 134–144)
Total Protein: 6.8 g/dL (ref 6.0–8.5)
eGFR: 91 mL/min/1.73

## 2024-10-08 LAB — URIC ACID: Uric Acid: 4.6 mg/dL (ref 3.0–7.2)

## 2024-10-08 LAB — SEDIMENTATION RATE: Sed Rate: 25 mm/h (ref 0–40)

## 2024-10-08 LAB — HEMOGLOBIN A1C
Est. average glucose Bld gHb Est-mCnc: 131 mg/dL
Hgb A1c MFr Bld: 6.2 % — ABNORMAL HIGH (ref 4.8–5.6)

## 2024-10-08 LAB — LIPOPROTEIN A (LPA): Lipoprotein (a): 187 nmol/L — ABNORMAL HIGH

## 2024-10-08 LAB — CYCLIC CITRUL PEPTIDE ANTIBODY, IGG/IGA: Cyclic Citrullin Peptide Ab: 9 U (ref 0–19)

## 2024-10-08 LAB — RHEUMATOID FACTOR: Rheumatoid fact SerPl-aCnc: <10 [IU]/mL

## 2024-10-08 LAB — ANTINUCLEAR ANTIBODIES, IFA: ANA Titer 1: NEGATIVE

## 2024-10-12 ENCOUNTER — Ambulatory Visit: Payer: Self-pay | Admitting: Internal Medicine

## 2024-10-13 DIAGNOSIS — E7841 Elevated Lipoprotein(a): Secondary | ICD-10-CM | POA: Insufficient documentation

## 2024-10-13 NOTE — Progress Notes (Signed)
 " Cardiology Office Note:  .   Date:  10/13/2024  ID:  Ruth Davis, DOB 01/30/1956, MRN [id number] PCP: Jarold Medici, MD  [facility] Providers Cardiologist:  Georganna Archer, MD { Chief Complaint:  Chief Complaint  Patient presents with   Chest Pain    History of Present Illness: .    Ruth Davis is a 69 y.o. female with a PMH of HTN, HLD, prediabetes, elevated LPA who presents as a new patient referral by Dr. Medici Jarold for the evaluation of chest pain.      Patient first evaluated by me on 10/14/2024 for chest pain.      Discussed the use of AI scribe software for clinical note transcription with the patient, who gave verbal consent to proceed.  History of Present Illness Ruth Davis is a 69 year old female who presents with exertional chest burning. She is accompanied by her husband. She was referred by Dr. Jarold for evaluation of exertional chest burning.  She experiences a burning sensation in her chest during physical activity, such as walking or exercising, especially in cold temperatures below 40 degrees Fahrenheit. The burning sensation improves when she slows down or stops the activity. This symptom is a new development over the past month, starting before Christmas, despite her long-standing routine of walking five to seven days a week.  Two days before her visit with Dr. Jarold, she vomited clear fluid while walking, despite not eating before the activity. No burning sensation occurs at rest, and there is no associated shortness of breath, profuse sweating, or lightheadedness.  She is currently taking amlodipine  2.5 mg and atorvastatin  80 mg. She takes atorvastatin  five days a week.  Her family history includes heart disease in two uncles on her father's side. She is uncertain about her mother's history but plans to confirm with her sisters.  Socially, she does not smoke or use illicit drugs and consumes alcohol occasionally,  socially.      Studies Reviewed: SABRA    EKG: I independently reviewed the patient's EKG and it shows NSR without ischemic changes           Results Labs Lipoprotein(a) (10/07/2024): 187 LDL (03/2024): 78  Risk Assessment/Calculations:               Physical Exam:    VS:  BP 124/78   Pulse 66   Ht 5' 4 (1.626 m)   Wt 186 lb 6.4 oz (84.6 kg)   SpO2 95%   BMI 32.00 kg/m      Wt Readings from Last 3 Encounters:  10/06/24 188 lb (85.3 kg)  06/11/24 187 lb (84.8 kg)  03/30/24 187 lb (84.8 kg)     GEN: Well nourished, well developed, in no acute distress NECK: No JVD; No carotid bruits CARDIAC: RRR, no murmurs, rubs, gallops RESPIRATORY:  Clear to auscultation without rales, wheezing or rhonchi  ABDOMEN: Soft, non-tender, non-distended, normal bowel sounds EXTREMITIES:  Warm and well perfused, no edema; No deformity, 2+ radial pulses PSYCH: Normal mood and affect   ASSESSMENT AND PLAN: .     #Stable Angina - She presents with symptoms of exertional chest burning with associated nausea that resolved at rest. - Her symptoms are very concerning for possible CAD particularly given her elevated LPA. - We will pursue a CCTA and a complete echocardiogram for evaluation. - I counseled the patient on modifying her exercise until her evaluation is complete to avoid stressing her heart. - I gave her ED precautions  if she develops chest pain at rest. CCTA Complete echocardiogram Follow-up in 3 months  #Elevated LPA #HLD -Last LDL was 78. - With her LPA being elevated I would prefer her LDL to be less than 70. - She is currently only taking her atorvastatin  5 days a week.  I instructed her to start taking it once a day. Start taking atorvastatin  7 days a week instead of 5 days a week Repeat lipid panel at follow-up          This note was written with the assistance of a dictation microphone or AI dictation software. Please excuse any typos or grammatical errors.    Signed, Georganna Archer, MD  10/13/2024 8:29 PM    [facility] "

## 2024-10-14 ENCOUNTER — Encounter: Payer: Self-pay | Admitting: Student in an Organized Health Care Education/Training Program

## 2024-10-14 ENCOUNTER — Other Ambulatory Visit (HOSPITAL_COMMUNITY): Payer: Self-pay

## 2024-10-14 ENCOUNTER — Ambulatory Visit
Attending: Student in an Organized Health Care Education/Training Program | Admitting: Student in an Organized Health Care Education/Training Program

## 2024-10-14 VITALS — BP 124/78 | HR 66 | Ht 64.0 in | Wt 186.4 lb

## 2024-10-14 DIAGNOSIS — R072 Precordial pain: Secondary | ICD-10-CM | POA: Insufficient documentation

## 2024-10-14 DIAGNOSIS — E7841 Elevated Lipoprotein(a): Secondary | ICD-10-CM | POA: Insufficient documentation

## 2024-10-14 LAB — BASIC METABOLIC PANEL WITH GFR
BUN/Creatinine Ratio: 12 (ref 12–28)
BUN: 10 mg/dL (ref 8–27)
CO2: 23 mmol/L (ref 20–29)
Calcium: 9.6 mg/dL (ref 8.7–10.3)
Chloride: 105 mmol/L (ref 96–106)
Creatinine, Ser: 0.82 mg/dL (ref 0.57–1.00)
Glucose: 92 mg/dL (ref 70–99)
Potassium: 4.3 mmol/L (ref 3.5–5.2)
Sodium: 143 mmol/L (ref 134–144)
eGFR: 78 mL/min/1.73

## 2024-10-14 MED ORDER — METOPROLOL TARTRATE 100 MG PO TABS
100.0000 mg | ORAL_TABLET | Freq: Once | ORAL | 0 refills | Status: AC
  Filled 2024-10-14: qty 1, 1d supply, fill #0

## 2024-10-14 NOTE — Patient Instructions (Addendum)
 Medication Instructions:  ON DAY OF CT metoprolol  tartrate (LOPRESSOR ) 100 MG tablet         Take 1 tablet (100 mg total) by mouth once. Take 90-120 minutes prior to scan. Hold for SBP less than 110    *If you need a refill on your cardiac medications before your next appointment, please call your pharmacy*  Lab Work: BMP  If you have labs (blood work) drawn today and your tests are completely normal, you will receive your results only by: MyChart Message (if you have MyChart) OR A paper copy in the mail If you have any lab test that is abnormal or we need to change your treatment, we will call you to review the results.  Testing/Procedures: CORONARY CTA     Your cardiac CT will be scheduled at one of the below locations:   [facility] [address]  [place], [place] [address]  If scheduled at the [facility] at [address], please enter the parking lot using the [address] entrance and use the FREE valet service at the patient drop-off area. Enter the building and check-in with registration on the main floor.  Please follow these instructions carefully (unless otherwise directed):  An IV will be required for this test and Nitroglycerin will be given.  Hold all erectile dysfunction medications at least 3 days (72 hrs) prior to test. (Ie viagra, cialis, sildenafil, tadalafil, etc)   On the Night Before the Test: Be sure to Drink plenty of water. Do not consume any caffeinated/decaffeinated beverages or chocolate 12 hours prior to your test. Do not take any antihistamines 12 hours prior to your test.   On the Day of the Test: Drink plenty of water until 1 hour prior to the test. Do not eat any food 1 hour prior to test. You may take your regular medications prior to the test.  Take metoprolol  (Lopressor ) two hours prior to test. If you take Furosemide/Hydrochlorothiazide/Spironolactone/Chlorthalidone, please HOLD on the  morning of the test. Patients who wear a continuous glucose monitor MUST remove the device prior to scanning. FEMALES- please wear underwire-free bra if available, avoid dresses & tight clothing      After the Test: Drink plenty of water. After receiving IV contrast, you may experience a mild flushed feeling. This is normal. On occasion, you may experience a mild rash up to 24 hours after the test. This is not dangerous. If this occurs, you can take Benadryl 25 mg, Zyrtec, Claritin, or Allegra and increase your fluid intake. (Patients taking Tikosyn should avoid Benadryl, and may take Zyrtec, Claritin, or Allegra) If you experience trouble breathing, this can be serious. If it is severe call 911 IMMEDIATELY. If it is mild, please call our office.  We will call to schedule your test 2-4 weeks out understanding that some insurance companies will need an authorization prior to the service being performed.   For more information and frequently asked questions, please visit our website : [email or web address]  For non-scheduling related questions, please contact the cardiac imaging nurse navigator should you have any questions/concerns: Cardiac Imaging Nurse Navigators Direct Office Dial: [phone]   For scheduling needs, including cancellations and rescheduling, please call Brittany, [phone].  ECHOCARDIOGRAM  Your physician has requested that you have an echocardiogram. Echocardiography is a painless test that uses sound waves to create images of your heart. It provides your doctor with information about the size and shape of your heart and how well your  hearts chambers and valves are working. This procedure takes approximately one hour. There are no restrictions for this procedure. Please do NOT wear cologne, perfume, aftershave, or lotions (deodorant is allowed). Please arrive 15 minutes prior to your appointment time.  Please note: We ask at that you not bring children  with you during ultrasound (echo/ vascular) testing. Due to room size and safety concerns, children are not allowed in the ultrasound rooms during exams. Our front office staff cannot provide observation of children in our lobby area while testing is being conducted. An adult accompanying a patient to their appointment will only be allowed in the ultrasound room at the discretion of the ultrasound technician under special circumstances. We apologize for any inconvenience.    Follow-Up: At [facility], you and your health needs are our priority.  As part of our continuing mission to provide you with exceptional heart care, our providers are all part of one team.  This team includes your primary Cardiologist (physician) and Advanced Practice Providers or APPs (Physician Assistants and Nurse Practitioners) who all work together to provide you with the care you need, when you need it.  Your next appointment:   3 month(s)  Provider:   Georganna Archer, MD    We recommend signing up for the patient portal called MyChart.  Sign up information is provided on this After Visit Summary.  MyChart is used to connect with patients for Virtual Visits (Telemedicine).  Patients are able to view lab/test results, encounter notes, upcoming appointments, etc.  Non-urgent messages can be sent to your provider as well.   To learn more about what you can do with MyChart, go to [email or web address].

## 2024-10-15 ENCOUNTER — Ambulatory Visit: Payer: Self-pay | Admitting: Student in an Organized Health Care Education/Training Program

## 2024-10-19 ENCOUNTER — Ambulatory Visit: Admitting: Orthopaedic Surgery

## 2024-10-19 DIAGNOSIS — M25511 Pain in right shoulder: Secondary | ICD-10-CM

## 2024-10-19 DIAGNOSIS — G8929 Other chronic pain: Secondary | ICD-10-CM

## 2024-10-19 NOTE — Progress Notes (Signed)
 "  Office Visit Note   Patient: Ruth Davis           Date of Birth: [birth year]           MRN: [id number] Visit Date: 10/19/2024              Requested by: Jarold Medici, MD [address] [place],  [place] [address] PCP: Jarold Medici, MD   Assessment & Plan: Visit Diagnoses:  1. Chronic right shoulder pain     Plan: MRI results reviewed which shows degenerative age-related changes to the rotator cuff and glenohumeral and AC joints, not interested in surgery.  Will continue with management with meloxicam  and activity modifications.  Follow up as needed.  Follow-Up Instructions: Return if symptoms worsen or fail to improve.   Orders:  No orders of the defined types were placed in this encounter.  No orders of the defined types were placed in this encounter.     Procedures: No procedures performed   Clinical Data: No additional findings.   Subjective: Chief Complaint  Patient presents with   Right Shoulder - Follow-up, Pain   Left Shoulder - Follow-up, Pain   Neck - Follow-up, Pain    HPI Patient here for MRI review of the right shoulder.  Underwent glenohumeral injection on 09/21/2024 with Dr. Burnetta.  Her symptoms have improved but still little sore but it took away the nagging pain. Review of Systems  Constitutional: Negative.   HENT: Negative.    Eyes: Negative.   Respiratory: Negative.    Cardiovascular: Negative.   Endocrine: Negative.   Musculoskeletal: Negative.   Neurological: Negative.   Hematological: Negative.   Psychiatric/Behavioral: Negative.    All other systems reviewed and are negative.    Objective: Vital Signs: There were no vitals taken for this visit.  Physical Exam Vitals and nursing note reviewed.  Constitutional:      Appearance: She is well-developed.  HENT:     Head: Atraumatic.     Nose: Nose normal.  Eyes:     Extraocular Movements: Extraocular movements intact.  Cardiovascular:     Pulses:  Normal pulses.  Pulmonary:     Effort: Pulmonary effort is normal.  Abdominal:     Palpations: Abdomen is soft.  Musculoskeletal:     Cervical back: Neck supple.  Skin:    General: Skin is warm.     Capillary Refill: Capillary refill takes less than 2 seconds.  Neurological:     Mental Status: She is alert. Mental status is at baseline.  Psychiatric:        Behavior: Behavior normal.        Thought Content: Thought content normal.        Judgment: Judgment normal.     Ortho Exam Exam of the right shoulder is basically unchanged from prior visit.  Specialty Comments:  No specialty comments available.  Imaging: No results found.   PMFS History: Patient Active Problem List   Diagnosis Date Noted   Elevated lipoprotein A level 10/13/2024   Bilateral shoulder pain 10/03/2023   Myalgia 09/28/2023   COVID 03/26/2023   Acute non-recurrent maxillary sinusitis 03/26/2023   Essential hypertension, benign 10/17/2022   Other abnormal glucose 10/17/2022   Vitamin D  deficiency disease 10/17/2022   Class 1 obesity due to excess calories with serious comorbidity and body mass index (BMI) of 33.0 to 33.9 in adult 10/17/2022   Pure hypercholesterolemia 09/10/2018   Abnormal thyroid blood test 09/10/2018   Past Medical  History:  Diagnosis Date   Allergic rhinitis    Allergy    Arthritis    Essential hypertension, benign    High cholesterol     Family History  Problem Relation Age of Onset   Breast cancer Sister    Cancer Sister    Hypertension Sister    Breast cancer Maternal Grandmother    Alzheimer's disease Mother    Hypertension Mother    Asthma Mother    Varicose Veins Mother    Bone cancer Father    Alcohol abuse Father    Cancer Father    Throat cancer Brother    Cancer Brother    Breast cancer Sister    Colon cancer Sister    Cancer Brother    Cancer Sister    Heart disease Paternal Uncle    Heart disease Paternal Uncle    Hypertension Sister     Hypertension Sister     Past Surgical History:  Procedure Laterality Date   REDUCTION MAMMAPLASTY Bilateral 2008   TUBAL LIGATION  1988   Social History   Occupational History   Not on file  Tobacco Use   Smoking status: Never   Smokeless tobacco: Never  Vaping Use   Vaping status: Never Used  Substance and Sexual Activity   Alcohol use: Not Currently    Comment: Occasionally   Drug use: Never   Sexual activity: Yes    Birth control/protection: Post-menopausal        "

## 2024-10-25 ENCOUNTER — Telehealth (HOSPITAL_COMMUNITY): Payer: Self-pay | Admitting: Emergency Medicine

## 2024-10-25 NOTE — Telephone Encounter (Signed)
 Reaching out to patient to offer assistance regarding upcoming cardiac imaging study; pt verbalizes understanding of appt date/time, parking situation and where to check in, pre-test NPO status and medications ordered, and verified current allergies; name and call back number provided for further questions should they arise Rockwell Alexandria RN Navigator Cardiac Imaging [facility] [phone] office [phone] cell

## 2024-10-26 ENCOUNTER — Ambulatory Visit: Admitting: Orthopaedic Surgery

## 2024-10-26 ENCOUNTER — Ambulatory Visit (HOSPITAL_COMMUNITY)
Admission: RE | Admit: 2024-10-26 | Discharge: 2024-10-26 | Disposition: A | Discharge status: Home / Self Care | Source: Ambulatory Visit | Attending: Student in an Organized Health Care Education/Training Program | Admitting: Student in an Organized Health Care Education/Training Program

## 2024-10-26 DIAGNOSIS — R072 Precordial pain: Secondary | ICD-10-CM | POA: Diagnosis not present

## 2024-10-26 MED ORDER — IOHEXOL 350 MG/ML SOLN
100.0000 mL | Freq: Once | INTRAVENOUS | Status: AC | PRN
  Administered 2024-10-26: 100 mL via INTRAVENOUS

## 2024-10-26 MED ORDER — NITROGLYCERIN 0.4 MG SL SUBL
0.8000 mg | SUBLINGUAL_TABLET | Freq: Once | SUBLINGUAL | Status: AC
  Administered 2024-10-26: 0.8 mg via SUBLINGUAL

## 2024-11-02 ENCOUNTER — Other Ambulatory Visit: Payer: Self-pay | Admitting: Medical Genetics

## 2024-11-03 ENCOUNTER — Ambulatory Visit

## 2024-11-03 VITALS — BP 120/84 | HR 64 | Temp 98.2°F | Ht 64.0 in | Wt 189.0 lb

## 2024-11-03 DIAGNOSIS — R21 Rash and other nonspecific skin eruption: Secondary | ICD-10-CM

## 2024-11-03 DIAGNOSIS — E66811 Obesity, class 1: Secondary | ICD-10-CM

## 2024-11-03 MED ORDER — PREDNISONE 20 MG PO TABS: 20.0000 mg | ORAL_TABLET | Freq: Every day | ORAL | 0 refills | Status: AC

## 2024-11-03 NOTE — Progress Notes (Signed)
 I,Jameka J Llittleton, CMA,acting as a neurosurgeon for [name], DO.,have documented all relevant documentation on the behalf of [name], DO,as directed by  [name], DO while in the presence of [name], DO.  Subjective:  Patient ID: Ruth Davis , female    DOB: Nov 28, 1955 , 69 y.o.   MRN: [id number]  Chief Complaint  Patient presents with   Rash    Patient presents today for a rash that appeared all over her body yesterday. He reports it is super itchy all over. She reports she has been using cocoa butter lotion.     HPI  HPI   Past Medical History:  Diagnosis Date   Allergic rhinitis    Allergy    Arthritis    Essential hypertension, benign    High cholesterol      Family History  Problem Relation Age of Onset   Breast cancer Sister    Cancer Sister    Hypertension Sister    Breast cancer Maternal Grandmother    Alzheimer's disease Mother    Hypertension Mother    Asthma Mother    Varicose Veins Mother    Bone cancer Father    Alcohol abuse Father    Cancer Father    Throat cancer Brother    Cancer Brother    Breast cancer Sister    Colon cancer Sister    Cancer Brother    Cancer Sister    Heart disease Paternal Uncle    Heart disease Paternal Uncle    Hypertension Sister    Hypertension Sister     Current Medications[1]   Allergies[2]   Review of Systems   Today's Vitals   11/03/24 1146  Temp: 98.2 F (36.8 C)  Weight: 189 lb (85.7 kg)  Height: 5' 4 (1.626 m)   Body mass index is 32.44 kg/m.  Wt Readings from Last 3 Encounters:  11/03/24 189 lb (85.7 kg)  10/14/24 186 lb 6.4 oz (84.6 kg)  10/06/24 188 lb (85.3 kg)    The 10-year ASCVD risk score (Arnett DK, et al., 2019) is: 9.2%   Values used to calculate the score:     Age: 81 years     Clinically relevant sex: Female     Is Non-Hispanic African American: Yes     Diabetic: No     Tobacco smoker: No     Systolic Blood Pressure: 136 mmHg     Is BP treated: Yes      HDL Cholesterol: 55 mg/dL     Total Cholesterol: 145 mg/dL  Objective:  Physical Exam      Assessment And Plan:   Assessment & Plan Rash and nonspecific skin eruption  Class 1 obesity due to excess calories with body mass index (BMI) of 32.0 to 32.9 in adult, unspecified whether serious comorbidity present   No orders of the defined types were placed in this encounter.    No follow-ups on file.  Patient was given opportunity to ask questions. Patient verbalized understanding of the plan and was able to repeat key elements of the plan. All questions were answered to their satisfaction.    LILLETTE [name], DO, have reviewed all documentation for this visit. The documentation on 11/03/24 for the exam, diagnosis, procedures, and orders are all accurate and complete.   IF YOU HAVE BEEN REFERRED TO A SPECIALIST, IT MAY TAKE 1-2 WEEKS TO SCHEDULE/PROCESS THE REFERRAL. IF YOU HAVE NOT HEARD FROM US /SPECIALIST IN TWO WEEKS,  PLEASE GIVE US  A CALL AT [phone] X [phone].     [1]  Current Outpatient Medications:    albuterol (VENTOLIN HFA) 108 (90 Base) MCG/ACT inhaler, Inhale into the lungs every 6 (six) hours as needed for wheezing or shortness of breath., Disp: , Rfl:    amLODipine  (NORVASC ) 2.5 MG tablet, Take 1 tablet (2.5 mg total) by mouth daily., Disp: 90 tablet, Rfl: 2   atorvastatin  (LIPITOR) 80 MG tablet, Take 1 tablet (80 mg total) by mouth daily., Disp: 90 tablet, Rfl: 1   azelastine  (ASTELIN ) 0.1 % nasal spray, Place 1 spray into both nostrils 2 (two) times daily. Use in each nostril as directed (Patient not taking: Reported on 10/14/2024), Disp: 30 mL, Rfl: 0   meloxicam  (MOBIC ) 7.5 MG tablet, TAKE 1 TABLET BY MOUTH TWICE A DAY AS NEEDED FOR SHOULDER PAIN, Disp: 60 tablet, Rfl: 0   metoprolol  tartrate (LOPRESSOR ) 100 MG tablet, Take 1 tablet (100 mg total) by mouth once. Take 90-120 minutes prior to scan. Hold for SBP less than 110., Disp: 1 tablet, Rfl: 0 [2]   Allergies Allergen Reactions   Acetaminophen Other (See Comments)    Hives/swelling   Fomepizole Other (See Comments)    Hives/swelling

## 2024-11-03 NOTE — Progress Notes (Signed)
" ° °  Acute Office Visit  Subjective:     Patient ID: Ruth Davis, female    DOB: [birth year], 69 y.o.   MRN: [id number]  Chief Complaint  Patient presents with   Rash    Patient presents today for a rash that appeared all over her body yesterday. He reports it is super itchy all over. She reports she has been using cocoa butter lotion.     This is a pleasant appearing 69 year old African-American female who is here because she broke out in a rash over her anterior bilateral lower extremities yesterday on 11/02/2024 after getting out of the shower.  She states that the very itchy type rash has spread to her back/arms/abdominal wall/upper chest wall.  She denies any new medication changes.  She denies any new perfumes or lotions but did purchase Downy scent beads 2 weeks ago because her send that she used for the previous 2 years they ran out of.  She denies having any animals.   Review of Systems  Constitutional:  Negative for chills, fever and malaise/fatigue.  Skin:  Positive for itching and rash.  Neurological:  Negative for dizziness and headaches.        Objective:    Vitals:   11/03/24 1146  BP: 120/84  Pulse: 64  Temp: 98.2 F (36.8 C)  Height: 5' 4 (1.626 m)  Weight: 189 lb (85.7 kg)  TempSrc: Oral  BMI (Calculated): 32.43      Physical Exam Vitals reviewed.  Constitutional:      Appearance: Normal appearance.  Skin:    Comments: The presence of a blotchy nonblanching very slight 1-2 mm erythematous macular skin lesions that is most prominent over the upper chest wall with inspection but was also present over the anterior bilateral lower extremities/ankles, bilateral upper shoulders and over the lower back with inspection.  Neurological:     Mental Status: She is alert.  Psychiatric:        Mood and Affect: Mood normal.        Behavior: Behavior normal.    No results found for any visits on 11/03/24.      Assessment & Plan:   Assessment &  Plan Rash and nonspecific skin eruption Patient was started on this patient was started on prednisone  20 mg once a day with food in the mornings beginning on 11/04/2024.  I advised her today to begin taking over-the-counter famotidine 20 mg along with an antihistamine i.e. Allegra/Claritin/Zyrtec and then take 20 mg of famotidine with 25 mg of Benadryl before bed.  She voiced understanding.  I advised her to stop taking the Downy scented beads.  She voiced understanding Orders:   predniSONE  (DELTASONE ) 20 MG tablet; Take 1 tablet (20 mg total) by mouth daily with breakfast for 5 days.  Class 1 obesity due to excess calories with body mass index (BMI) of 32.0 to 32.9 in adult, unspecified whether serious comorbidity present      Return if symptoms worsen or fail to improve.  [name], DO   "

## 2024-11-09 ENCOUNTER — Ambulatory Visit

## 2024-11-17 ENCOUNTER — Ambulatory Visit (HOSPITAL_COMMUNITY)

## 2024-11-23 ENCOUNTER — Other Ambulatory Visit (HOSPITAL_COMMUNITY)

## 2025-01-13 ENCOUNTER — Ambulatory Visit: Admitting: Student in an Organized Health Care Education/Training Program

## 2025-03-31 ENCOUNTER — Encounter: Payer: Self-pay | Admitting: Internal Medicine
